# Patient Record
Sex: Male | Born: 1985 | Race: White | Hispanic: No | Marital: Single | Smoking: Current every day smoker
Health system: Southern US, Community
[De-identification: ages and names within clinical notes are randomized; demographics above are authoritative.]

## PROBLEM LIST (undated history)

## (undated) DIAGNOSIS — J45909 Unspecified asthma, uncomplicated: Secondary | ICD-10-CM

## (undated) DIAGNOSIS — J449 Chronic obstructive pulmonary disease, unspecified: Secondary | ICD-10-CM

## (undated) HISTORY — PX: ELBOW SURGERY: SHX618

## (undated) HISTORY — PX: SHOULDER ARTHROSCOPY: SHX128

## (undated) HISTORY — PX: MANDIBLE FRACTURE SURGERY: SHX706

---

## 2016-10-09 ENCOUNTER — Emergency Department
Admission: EM | Admit: 2016-10-09 | Discharge: 2016-10-09 | Disposition: A | Discharge status: Home / Self Care | Payer: Medicare Other | Attending: Emergency Medicine | Admitting: Emergency Medicine

## 2016-10-09 ENCOUNTER — Encounter: Payer: Self-pay | Admitting: Emergency Medicine

## 2016-10-09 DIAGNOSIS — R0781 Pleurodynia: Secondary | ICD-10-CM | POA: Diagnosis not present

## 2016-10-09 MED ORDER — KETOROLAC TROMETHAMINE 30 MG/ML IJ SOLN
30.0000 mg | Freq: Once | INTRAMUSCULAR | Status: AC
  Administered 2016-10-09: 30 mg via INTRAMUSCULAR
  Filled 2016-10-09: qty 1

## 2016-10-09 MED ORDER — CYCLOBENZAPRINE HCL 10 MG PO TABS: 10.0000 mg | ORAL_TABLET | Freq: Three times a day (TID) | ORAL | 0 refills | Status: AC | PRN

## 2016-10-09 MED ORDER — KETOROLAC TROMETHAMINE 10 MG PO TABS: 10.0000 mg | ORAL_TABLET | Freq: Four times a day (QID) | ORAL | 0 refills | Status: AC | PRN

## 2016-10-09 MED ORDER — CYCLOBENZAPRINE HCL 10 MG PO TABS
10.0000 mg | ORAL_TABLET | Freq: Once | ORAL | Status: AC
  Administered 2016-10-09: 10 mg via ORAL
  Filled 2016-10-09: qty 1
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
## 2016-10-09 NOTE — ED Notes (Signed)

## 2018-11-20 DIAGNOSIS — F329 Major depressive disorder, single episode, unspecified: Secondary | ICD-10-CM | POA: Insufficient documentation

## 2018-11-20 DIAGNOSIS — F3289 Other specified depressive episodes: Secondary | ICD-10-CM | POA: Insufficient documentation

## 2018-11-20 DIAGNOSIS — G909 Disorder of the autonomic nervous system, unspecified: Secondary | ICD-10-CM | POA: Insufficient documentation

## 2018-11-20 DIAGNOSIS — M2010 Hallux valgus (acquired), unspecified foot: Secondary | ICD-10-CM | POA: Insufficient documentation

## 2018-11-20 DIAGNOSIS — M5430 Sciatica, unspecified side: Secondary | ICD-10-CM | POA: Insufficient documentation

## 2018-11-20 DIAGNOSIS — G8921 Chronic pain due to trauma: Secondary | ICD-10-CM | POA: Insufficient documentation

## 2018-11-27 DIAGNOSIS — B178 Other specified acute viral hepatitis: Secondary | ICD-10-CM | POA: Insufficient documentation

## 2018-11-27 DIAGNOSIS — B199 Unspecified viral hepatitis without hepatic coma: Secondary | ICD-10-CM | POA: Insufficient documentation

## 2018-12-11 ENCOUNTER — Encounter: Payer: Self-pay | Admitting: Podiatry

## 2018-12-11 ENCOUNTER — Other Ambulatory Visit: Payer: Self-pay

## 2018-12-11 ENCOUNTER — Ambulatory Visit (INDEPENDENT_AMBULATORY_CARE_PROVIDER_SITE_OTHER): Payer: Medicare Other | Admitting: Podiatry

## 2018-12-11 ENCOUNTER — Ambulatory Visit (INDEPENDENT_AMBULATORY_CARE_PROVIDER_SITE_OTHER): Payer: Medicare Other

## 2018-12-11 DIAGNOSIS — M2012 Hallux valgus (acquired), left foot: Secondary | ICD-10-CM

## 2018-12-11 DIAGNOSIS — M7752 Other enthesopathy of left foot: Secondary | ICD-10-CM | POA: Diagnosis not present

## 2018-12-11 DIAGNOSIS — M2010 Hallux valgus (acquired), unspecified foot: Secondary | ICD-10-CM | POA: Diagnosis not present

## 2018-12-11 DIAGNOSIS — M7751 Other enthesopathy of right foot: Secondary | ICD-10-CM | POA: Diagnosis not present

## 2018-12-11 DIAGNOSIS — M2042 Other hammer toe(s) (acquired), left foot: Secondary | ICD-10-CM

## 2018-12-11 DIAGNOSIS — M2011 Hallux valgus (acquired), right foot: Secondary | ICD-10-CM

## 2018-12-11 NOTE — Patient Instructions (Signed)
Pre-Operative Instructions  Congratulations, you have decided to take an important step towards improving your quality of life.  You can be assured that the doctors and staff at [facility] will be with you every step of the way.  Here are some important things you should know:  1. Plan to be at the surgery center/hospital at least 1 (one) hour prior to your scheduled time, unless otherwise directed by the surgical center/hospital staff.  You must have a responsible adult accompany you, remain during the surgery and drive you home.  Make sure you have directions to the surgical center/hospital to ensure you arrive on time. 2. If you are having surgery at [facility] or [facility] hospitals, you will need a copy of your medical history and physical form from your family physician within one month prior to the date of surgery. We will give you a form for your primary physician to complete.  3. We make every effort to accommodate the date you request for surgery.  However, there are times where surgery dates or times have to be moved.  We will contact you as soon as possible if a change in schedule is required.   4. No aspirin/ibuprofen for one week before surgery.  If you are on aspirin, any non-steroidal anti-inflammatory medications (Mobic, Aleve, Ibuprofen) should not be taken seven (7) days prior to your surgery.  You make take Tylenol for pain prior to surgery.  5. Medications - If you are taking daily heart and blood pressure medications, seizure, reflux, allergy, asthma, anxiety, pain or diabetes medications, make sure you notify the surgery center/hospital before the day of surgery so they can tell you which medications you should take or avoid the day of surgery. 6. No food or drink after midnight the night before surgery unless directed otherwise by surgical center/hospital staff. 7. No alcoholic beverages 24-hours prior to surgery.  No smoking 24-hours prior or 24-hours after  surgery. 8. Wear loose pants or shorts. They should be loose enough to fit over bandages, boots, and casts. 9. Don't wear slip-on shoes. Sneakers are preferred. 10. Bring your boot with you to the surgery center/hospital.  Also bring crutches or a walker if your physician has prescribed it for you.  If you do not have this equipment, it will be provided for you after surgery. 11. If you have not been contacted by the surgery center/hospital by the day before your surgery, call to confirm the date and time of your surgery. 12. Leave-time from work may vary depending on the type of surgery you have.  Appropriate arrangements should be made prior to surgery with your employer. 13. Prescriptions will be provided immediately following surgery by your doctor.  Fill these as soon as possible after surgery and take the medication as directed. Pain medications will not be refilled on weekends and must be approved by the doctor. 14. Remove nail polish on the operative foot and avoid getting pedicures prior to surgery. 15. Wash the night before surgery.  The night before surgery wash the foot and leg well with water and the antibacterial soap provided. Be sure to pay special attention to beneath the toenails and in between the toes.  Wash for at least three (3) minutes. Rinse thoroughly with water and dry well with a towel.  Perform this wash unless told not to do so by your physician.  Enclosed: 1 Ice pack (please put in freezer the night before surgery)   1 Hibiclens skin cleaner     Pre-op instructions  If you have any questions regarding the instructions, please do not hesitate to call our office.  [place]: [address], [place], [place] [address] -- [phone]  [place]: [address] [place], [place] [address] -- [phone]  [place]: [address]  [place], [place] [address] -- [phone]   Website: [email or web address] 

## 2018-12-13 NOTE — Progress Notes (Signed)
   Subjective: 33 y.o. male presenting today as a new patient with a chief complaint of constant throbbing pain noted to the bilateral great toes secondary to bunions that have been present for the past few years. She states the 2nd toes are crossing over the great toes. Wearing shoes and walking increases the pain. She has been taking Ibuprofen and Tylenol as well as using heat therapy for treatment. Patient is here for further evaluation and treatment.   No past medical history on file.    Objective: Physical Exam General: The patient is alert and oriented x3 in no acute distress.  Dermatology: Skin is cool, dry and supple bilateral lower extremities. Negative for open lesions or macerations.  Vascular: Palpable pedal pulses bilaterally. No edema or erythema noted. Capillary refill within normal limits.  Neurological: Epicritic and protective threshold grossly intact bilaterally.   Musculoskeletal Exam: Clinical evidence of bunion deformity noted to the respective foot. There is moderate pain on palpation range of motion of the first MPJ. Lateral deviation of the hallux noted consistent with hallux abductovalgus. Hammertoe contracture also noted on clinical exam to digits 3 and 4 of the right foot and the 2nd digits bilaterally. Symptomatic pain on palpation and range of motion also noted to the metatarsal phalangeal joints of the respective hammertoe digits.    Radiographic Exam: Increased intermetatarsal angle greater than 15 with a hallux abductus angle greater than 30 noted on AP view. Moderate degenerative changes noted within the first MPJ. Contracture deformity also noted to the interphalangeal joints and MPJs of the digits of the respective hammertoes.    Assessment: 1. HAV w/ bunion deformity bilateral  2. Hammertoe deformity digits 3, 4 right; 2nd digit bilateral 3. 1st MPJ capsulitis bilateral     Plan of Care:  1. Patient was evaluated. X-Rays reviewed. 2. Today we  discussed the conservative versus surgical management of the presenting pathology. The patient opts for surgical management. All possible complications and details of the procedure were explained. All patient questions were answered. No guarantees were expressed or implied. 3. Authorization for surgery was initiated today. Surgery will consist of bunionectomy with double osteotomy left; PIPJ arthroplasty with MPJ capsulotomy 2nd left.  4. Injection of 0.5 mLs Celestone Soluspan injected into the bilateral 1st MPJs.  5. Return to clinic one week post op.   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    Edrick Kins, DPM [facility]  Dr. Edrick Kins, [place]                                        [place], [place] [address]                Office [phone]  Fax [phone]

## 2018-12-24 ENCOUNTER — Telehealth: Payer: Self-pay | Admitting: *Deleted

## 2018-12-24 NOTE — Telephone Encounter (Signed)
"  They told me to call you to schedule my surgery.  I forgot all about it."  Do you have a date that you would like?  "I'd like to do it this month."  Dr. Amalia Hailey cannot do it this month.  He can do it on December 11, 17 or 18.  "Let's schedule it for December 11, what time?"  Someone from the surgical center will give you a call a day or two prior to your surgery date and will give you your arrival time.  You need to go online and register if you have access to a computer.  "Ma'am, I don't fool around with no computer."  Don't worry about it, someone from the surgical center will give you a call a day or two prior to your surgery date and will ask you for the information they need.

## 2018-12-30 ENCOUNTER — Emergency Department
Admission: EM | Admit: 2018-12-30 | Discharge: 2018-12-30 | Disposition: A | Discharge status: Home / Self Care | Payer: Medicare Other | Attending: Emergency Medicine | Admitting: Emergency Medicine

## 2018-12-30 ENCOUNTER — Other Ambulatory Visit: Payer: Self-pay

## 2018-12-30 DIAGNOSIS — T7029XA Other effects of high altitude, initial encounter: Secondary | ICD-10-CM | POA: Insufficient documentation

## 2018-12-30 DIAGNOSIS — H9192 Unspecified hearing loss, left ear: Secondary | ICD-10-CM | POA: Diagnosis not present

## 2018-12-30 DIAGNOSIS — J449 Chronic obstructive pulmonary disease, unspecified: Secondary | ICD-10-CM | POA: Insufficient documentation

## 2018-12-30 DIAGNOSIS — W363XXA Explosion and rupture of pressurized-gas tank, initial encounter: Secondary | ICD-10-CM | POA: Insufficient documentation

## 2018-12-30 DIAGNOSIS — Y9389 Activity, other specified: Secondary | ICD-10-CM | POA: Insufficient documentation

## 2018-12-30 DIAGNOSIS — F17210 Nicotine dependence, cigarettes, uncomplicated: Secondary | ICD-10-CM | POA: Diagnosis not present

## 2018-12-30 DIAGNOSIS — Y9289 Other specified places as the place of occurrence of the external cause: Secondary | ICD-10-CM | POA: Insufficient documentation

## 2018-12-30 DIAGNOSIS — Z79899 Other long term (current) drug therapy: Secondary | ICD-10-CM | POA: Insufficient documentation

## 2018-12-30 DIAGNOSIS — Y990 Civilian activity done for income or pay: Secondary | ICD-10-CM | POA: Diagnosis not present

## 2018-12-30 HISTORY — DX: Unspecified asthma, uncomplicated: J45.909

## 2018-12-30 HISTORY — DX: Chronic obstructive pulmonary disease, unspecified: J44.9

## 2018-12-30 MED ORDER — ACETAMINOPHEN 325 MG PO TABS
650.0000 mg | ORAL_TABLET | Freq: Once | ORAL | Status: AC
  Administered 2018-12-30: 650 mg via ORAL
  Filled 2018-12-30: qty 2
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
## 2018-12-30 NOTE — ED Notes (Signed)
Spoke to Dr. Cherylann Banas, no blood work or scans at this time.

## 2018-12-30 NOTE — ED Notes (Signed)
NAD noted at time of D/C. Pt denies questions or concerns. Pt ambulatory to the lobby at this time.  

## 2018-12-30 NOTE — ED Notes (Signed)
Unable to obtain E-sig at this time due to computers not working.
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
## 2019-01-01 ENCOUNTER — Encounter: Payer: Self-pay | Admitting: Emergency Medicine

## 2019-01-01 ENCOUNTER — Emergency Department
Admission: EM | Admit: 2019-01-01 | Discharge: 2019-01-01 | Disposition: A | Discharge status: Home / Self Care | Payer: Medicare Other | Attending: Emergency Medicine | Admitting: Emergency Medicine

## 2019-01-01 ENCOUNTER — Other Ambulatory Visit: Payer: Self-pay

## 2019-01-01 DIAGNOSIS — Z79899 Other long term (current) drug therapy: Secondary | ICD-10-CM | POA: Diagnosis not present

## 2019-01-01 DIAGNOSIS — Z046 Encounter for general psychiatric examination, requested by authority: Secondary | ICD-10-CM | POA: Insufficient documentation

## 2019-01-01 DIAGNOSIS — Y906 Blood alcohol level of 120-199 mg/100 ml: Secondary | ICD-10-CM | POA: Diagnosis not present

## 2019-01-01 DIAGNOSIS — F4324 Adjustment disorder with disturbance of conduct: Secondary | ICD-10-CM | POA: Insufficient documentation

## 2019-01-01 DIAGNOSIS — J449 Chronic obstructive pulmonary disease, unspecified: Secondary | ICD-10-CM | POA: Insufficient documentation

## 2019-01-01 DIAGNOSIS — F17210 Nicotine dependence, cigarettes, uncomplicated: Secondary | ICD-10-CM | POA: Diagnosis not present

## 2019-01-01 DIAGNOSIS — F10129 Alcohol abuse with intoxication, unspecified: Secondary | ICD-10-CM | POA: Diagnosis not present

## 2019-01-01 DIAGNOSIS — Z008 Encounter for other general examination: Secondary | ICD-10-CM

## 2019-01-01 DIAGNOSIS — F319 Bipolar disorder, unspecified: Secondary | ICD-10-CM | POA: Diagnosis not present

## 2019-01-01 LAB — COMPREHENSIVE METABOLIC PANEL
ALT: 74 U/L — ABNORMAL HIGH (ref 0–44)
AST: 60 U/L — ABNORMAL HIGH (ref 15–41)
Albumin: 4.3 g/dL (ref 3.5–5.0)
Alkaline Phosphatase: 87 U/L (ref 38–126)
Anion gap: 9 (ref 5–15)
BUN: 21 mg/dL — ABNORMAL HIGH (ref 6–20)
CO2: 19 mmol/L — ABNORMAL LOW (ref 22–32)
Calcium: 8.7 mg/dL — ABNORMAL LOW (ref 8.9–10.3)
Chloride: 108 mmol/L (ref 98–111)
Creatinine, Ser: 0.97 mg/dL (ref 0.61–1.24)
GFR calc Af Amer: >60 mL/min (ref >60)
GFR calc non Af Amer: >60 mL/min (ref >60)
Glucose, Bld: 113 mg/dL — ABNORMAL HIGH (ref 70–99)
Potassium: 4.1 mmol/L (ref 3.5–5.1)
Sodium: 136 mmol/L (ref 135–145)
Total Bilirubin: 0.6 mg/dL (ref 0.3–1.2)
Total Protein: 7.7 g/dL (ref 6.5–8.1)

## 2019-01-01 LAB — CBC
HCT: 45.9 % (ref 39.0–52.0)
Hemoglobin: 16.1 g/dL (ref 13.0–17.0)
MCH: 33.5 pg (ref 26.0–34.0)
MCHC: 35.1 g/dL (ref 30.0–36.0)
MCV: 95.4 fL (ref 80.0–100.0)
Platelets: 286 10*3/uL (ref 150–400)
RBC: 4.81 MIL/uL (ref 4.22–5.81)
RDW: 12.4 % (ref 11.5–15.5)
WBC: 11.7 10*3/uL — ABNORMAL HIGH (ref 4.0–10.5)
nRBC: 0 % (ref 0.0–0.2)

## 2019-01-01 LAB — SALICYLATE LEVEL: Salicylate Lvl: <7 mg/dL (ref 2.8–30.0)

## 2019-01-01 LAB — ACETAMINOPHEN LEVEL: Acetaminophen (Tylenol), Serum: <10 ug/mL — ABNORMAL LOW (ref 10–30)

## 2019-01-01 LAB — ETHANOL: Alcohol, Ethyl (B): 123 mg/dL — ABNORMAL HIGH (ref <10)

## 2019-01-01 MED ORDER — LORAZEPAM 2 MG/ML IJ SOLN
INTRAMUSCULAR | Status: AC
  Filled 2019-01-01: qty 1

## 2019-01-01 MED ORDER — HALOPERIDOL LACTATE 5 MG/ML IJ SOLN
INTRAMUSCULAR | Status: AC
  Filled 2019-01-01: qty 1

## 2019-01-01 NOTE — ED Notes (Signed)
Pr provided with a breakfast tray and cup of water.

## 2019-01-01 NOTE — ED Notes (Signed)
Dietary called for soft breakfast tray, patient states has no teeth, unable to chew the bacon.
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
## 2019-01-01 NOTE — Discharge Instructions (Addendum)
Cleared by psychiatry team to go home.  Return to ER for any other concerns.
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
## 2019-01-01 NOTE — ED Notes (Signed)
Patient IVD'd belonging obtained, patient refusing to change into burgundy scrubs, as per Dr. Nickolas Madrid ok to leave him in his home clothes until evaluated by psych this morning.
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
## 2019-01-01 NOTE — ED Notes (Signed)
Patient evaluated by psych awaiting discharge papers.

## 2019-01-01 NOTE — ED Notes (Signed)
Pt ambulatory to bathroom. This tech asked pt if he was provided a cup for urine. Pt stated "they took my blood they don't need urine."
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
## 2019-01-21 ENCOUNTER — Telehealth: Payer: Self-pay | Admitting: *Deleted

## 2019-01-21 NOTE — Telephone Encounter (Signed)
Patient cancel. Just started a new job and needs Christmas money. Will call back.  [facility], Business Office  [address]  [place], [place] [address] [phone] (F)   I canceled his surgery for 01/25/2019 per the email from Aneta at [facility].

## 2019-01-25 ENCOUNTER — Telehealth: Payer: Self-pay | Admitting: *Deleted

## 2019-01-25 NOTE — Telephone Encounter (Signed)
"  I want to see what time my appointment is today."  I am returning your call.  We do not have you scheduled for today.  You called and stated you had just started a new job and would have to be able to take off from work.  "I wasn't able to start the job because I didn't have the identification that was needed.  It's going to take me two to three weeks to get the IDs.  So, I thought I would go ahead and have the surgery."  I don't know if we'll be able to do it today.  I'll call and see what I can find out.  "Could you do that and call me back and let me know.  I had a family member come up from [place] to take me."  I'll call you back and let you know.  "Did you find out anything?"  Yes, I did.  Dr. Amalia Hailey will not be able to do your surgery today.  "Okay, I'd like to schedule it for as soon as possible."  Dr. Amalia Hailey next available date is not until March.  "Okay, that is fine.  What date?"  He can do it on May 09, 2019.  "Okay, I will put it down."  I called Caren Griffins at the surgical center and asked her to put Mr. Walthall on the schedule for 05/09/2019 with Dr. Amalia Hailey.

## 2019-02-05 ENCOUNTER — Other Ambulatory Visit: Payer: Self-pay | Admitting: Student

## 2019-02-05 DIAGNOSIS — R768 Other specified abnormal immunological findings in serum: Secondary | ICD-10-CM

## 2019-02-05 DIAGNOSIS — B182 Chronic viral hepatitis C: Secondary | ICD-10-CM | POA: Insufficient documentation

## 2019-02-13 ENCOUNTER — Other Ambulatory Visit: Payer: Self-pay

## 2019-02-13 ENCOUNTER — Ambulatory Visit
Admission: RE | Admit: 2019-02-13 | Discharge: 2019-02-13 | Disposition: A | Discharge status: Home / Self Care | Payer: Medicare Other | Source: Ambulatory Visit | Attending: Student | Admitting: Student

## 2019-02-13 DIAGNOSIS — R768 Other specified abnormal immunological findings in serum: Secondary | ICD-10-CM | POA: Insufficient documentation

## 2019-02-15 DIAGNOSIS — J449 Chronic obstructive pulmonary disease, unspecified: Secondary | ICD-10-CM | POA: Diagnosis present

## 2019-02-19 ENCOUNTER — Other Ambulatory Visit: Payer: Self-pay | Admitting: Student

## 2019-02-19 DIAGNOSIS — R93429 Abnormal radiologic findings on diagnostic imaging of unspecified kidney: Secondary | ICD-10-CM

## 2019-02-25 ENCOUNTER — Ambulatory Visit: Admission: RE | Admit: 2019-02-25 | Payer: Medicare Other | Source: Ambulatory Visit

## 2019-02-25 ENCOUNTER — Ambulatory Visit
Admission: RE | Admit: 2019-02-25 | Discharge: 2019-02-25 | Disposition: A | Discharge status: Home / Self Care | Payer: Medicare Other | Source: Ambulatory Visit | Attending: Student | Admitting: Student

## 2019-02-25 ENCOUNTER — Other Ambulatory Visit: Payer: Self-pay | Admitting: Student

## 2019-02-25 ENCOUNTER — Other Ambulatory Visit: Payer: Self-pay

## 2019-02-25 DIAGNOSIS — R93429 Abnormal radiologic findings on diagnostic imaging of unspecified kidney: Secondary | ICD-10-CM | POA: Diagnosis present

## 2019-02-25 MED ORDER — IOHEXOL 300 MG/ML  SOLN
100.0000 mL | Freq: Once | INTRAMUSCULAR | Status: AC | PRN
  Administered 2019-02-25: 100 mL via INTRAVENOUS

## 2019-02-28 ENCOUNTER — Other Ambulatory Visit: Payer: Self-pay

## 2019-02-28 ENCOUNTER — Emergency Department
Admission: EM | Admit: 2019-02-28 | Discharge: 2019-02-28 | Disposition: A | Discharge status: Home / Self Care | Payer: Medicare Other | Attending: Emergency Medicine | Admitting: Emergency Medicine

## 2019-02-28 ENCOUNTER — Encounter: Payer: Self-pay | Admitting: Emergency Medicine

## 2019-02-28 DIAGNOSIS — M5431 Sciatica, right side: Secondary | ICD-10-CM | POA: Diagnosis not present

## 2019-02-28 DIAGNOSIS — J45909 Unspecified asthma, uncomplicated: Secondary | ICD-10-CM | POA: Diagnosis not present

## 2019-02-28 DIAGNOSIS — F17210 Nicotine dependence, cigarettes, uncomplicated: Secondary | ICD-10-CM | POA: Diagnosis not present

## 2019-02-28 DIAGNOSIS — Z79899 Other long term (current) drug therapy: Secondary | ICD-10-CM | POA: Insufficient documentation

## 2019-02-28 DIAGNOSIS — J449 Chronic obstructive pulmonary disease, unspecified: Secondary | ICD-10-CM | POA: Insufficient documentation

## 2019-02-28 DIAGNOSIS — M549 Dorsalgia, unspecified: Secondary | ICD-10-CM | POA: Diagnosis present

## 2019-02-28 LAB — URINALYSIS, COMPLETE (UACMP) WITH MICROSCOPIC
Bacteria, UA: NONE SEEN
Bilirubin Urine: NEGATIVE
Glucose, UA: NEGATIVE mg/dL
Hgb urine dipstick: NEGATIVE
Ketones, ur: NEGATIVE mg/dL
Leukocytes,Ua: NEGATIVE
Nitrite: NEGATIVE
Protein, ur: NEGATIVE mg/dL
Specific Gravity, Urine: 1.006 (ref 1.005–1.030)
pH: 6 (ref 5.0–8.0)

## 2019-02-28 MED ORDER — PREDNISONE 10 MG (21) PO TBPK: ORAL_TABLET | ORAL | 0 refills | Status: DC

## 2019-02-28 MED ORDER — TRAMADOL HCL 50 MG PO TABS: 50.0000 mg | ORAL_TABLET | Freq: Four times a day (QID) | ORAL | 0 refills | Status: DC | PRN

## 2019-02-28 MED ORDER — KETOROLAC TROMETHAMINE 30 MG/ML IJ SOLN
30.0000 mg | Freq: Once | INTRAMUSCULAR | Status: AC
  Administered 2019-02-28: 30 mg via INTRAMUSCULAR
  Filled 2019-02-28 (×2): qty 1

## 2019-02-28 NOTE — Discharge Instructions (Signed)
Please follow up with neurosurgery if not improving over the next few days.  Return to the ER for symptoms that change or worsen if unable to schedule an appointment.

## 2019-02-28 NOTE — ED Provider Notes (Signed)
[facility] Emergency Department Provider Note ____________________________________________  Time seen: Approximately 3:27 PM  I have reviewed the triage vital signs and the nursing notes.  HISTORY  Chief Complaint Back Pain   HPI Donald [name] is a 34 y.o. male presents with right side back pain radiation into posterior thigh to knee. No relief with ibuprofen and flexeril. Symptoms may have been caused by pulling a heavy object at work a few days ago. No urinary symptoms.  Past Medical History:  Diagnosis Date  . Asthma   . COPD (chronic obstructive pulmonary disease) [facility])     Patient Active Problem List   Diagnosis Date Noted  . Adjustment disorder with disturbance of conduct   . Other viral hepatitis 11/27/2018  . Chronic pain due to trauma 11/20/2018  . Hallux valgus, acquired 11/20/2018  . Other depressive disorder 11/20/2018  . Peripheral autonomic neuropathy 11/20/2018  . Sciatica 11/20/2018    Past Surgical History:  Procedure Laterality Date  . ELBOW SURGERY    . MANDIBLE FRACTURE SURGERY    . SHOULDER ARTHROSCOPY      Prior to Admission medications   Medication Sig Start Date End Date Taking? Authorizing Provider  busPIRone (BUSPAR) 10 MG tablet busPIRone HCl 10 MG Oral Tablet QTY: 0 tablet Days: 0 Refills: 0  Written: 11/20/18 Patient Instructions: 11/20/18   [provider]  cyclobenzaprine (FEXMID) 7.5 MG tablet Cyclobenzaprine HCl 7.5 MG Oral Tablet QTY: 60 tablet Days: 30 Refills: 0  Written: 11/27/18 Patient Instructions: 1 tablet by mouth twice daily as needed for back pain 11/27/18   [provider]  meloxicam (MOBIC) 15 MG tablet Meloxicam 15 MG Oral Tablet QTY: 90 tablet Days: 90 Refills: 0  Written: 11/20/18 Patient Instructions: once a day 11/20/18   [provider]  predniSONE (STERAPRED UNI-PAK 21 TAB) 10 MG (21) TBPK tablet Take 6 tablets on the first day and decrease by 1 tablet each day until  finished. 02/28/19   , Rulon Eisenmenger B, FNP  pregabalin (LYRICA) 75 MG capsule Take 75 mg by mouth 2 (two) times daily.    [provider]  traMADol (ULTRAM) 50 MG tablet Take 1 tablet (50 mg total) by mouth every 6 (six) hours as needed. 02/28/19   Chinita Pester, FNP    Allergies Ceclor  [cefaclor]  No family history on file.  Social History Social History   Tobacco Use  . Smoking status: Current Every Day Smoker    Packs/day: 0.50    Types: Cigarettes  . Smokeless tobacco: Never Used  . Tobacco comment: 1/5 ppd  Substance Use Topics  . Alcohol use: Yes    Comment: social drinker  . Drug use: Not Currently    Review of Systems Constitutional: Well appearing. Respiratory: Negative for dyspnea. Cardiovascular: Negative for change in skin temperature or color. Musculoskeletal:   Negative for chronic steroid use   Negative for trauma in the presence of osteoporosis  Negative for age over 27 and trauma.  Negative for constitutional symptoms, or history of cancer   Negative for pain worse at night. Skin: Negative for rash, lesion, or wound.  Genitourinary: Negative for urinary retention. Rectal: Negative for fecal incontinence or new onset constipation/bowel habit changes. Hematological/Immunilogical: Negative for immunosuppression, IV drug use, or fever Neurological: Negative for burning, tingling, numb, electric, radiating pain in the lower extremities.                        Negative for  saddle anesthesia.                        Negative for focal neurologic deficit, progressive or disabling symptoms             Negative for saddle anesthesia. ____________________________________________   PHYSICAL EXAM:  VITAL SIGNS: ED Triage Vitals  Enc Vitals Group     BP 02/28/19 1456 120/85     Pulse Rate 02/28/19 1456 75     Resp 02/28/19 1456 16     Temp 02/28/19 1456 98.6 F (37 C)     Temp Source 02/28/19 1456 Oral     SpO2 02/28/19 1456 96 %     Weight  02/28/19 1452 210 lb (95.3 kg)     Height 02/28/19 1452 6' (1.829 m)     Head Circumference --      Peak Flow --      Pain Score 02/28/19 1452 10     Pain Loc --      Pain Edu? --      Excl. in [place]? --     Constitutional: Alert and oriented. Well appearing and in no acute distress. Eyes: Conjunctivae are clear without discharge or drainage.  Head: Atraumatic. Neck: Full, active range of motion. Respiratory: Respirations even and unlabored. Musculoskeletal: Decreased ROM of the back and extremities, Strength 5/5 of the lower extremities as tested. Neurologic: Reflexes of the lower extremities are 2+. Positive straight leg raise on the right side. Skin: Atraumatic.  Psychiatric: Behavior and affect are normal.  ____________________________________________   LABS (all labs ordered are listed, but only abnormal results are displayed)  Labs Reviewed  URINALYSIS, COMPLETE (UACMP) WITH MICROSCOPIC - Abnormal; Notable for the following components:      Result Value   Color, Urine YELLOW (*)    APPearance CLEAR (*)    All other components within normal limits   ____________________________________________  RADIOLOGY  Not indicated. ____________________________________________   PROCEDURES  Procedure(s) performed:  Procedures ____________________________________________   INITIAL IMPRESSION / ASSESSMENT AND PLAN / ED COURSE  Donald [name] is a 34 y.o. male presents to the emergency department for treatment and evaluation of right side flank/back pain.   He will be treated with prednisone and tramadol.  He was advised that he needs to call his neurosurgeon and request a follow-up appointment.  He is to return to the emergency department for symptoms of change or worsen if unable to schedule an appointment.  Medications  ketorolac (TORADOL) 30 MG/ML injection 30 mg (30 mg Intramuscular Given 02/28/19 1620)    ED Discharge Orders         Ordered    predniSONE (STERAPRED  UNI-PAK 21 TAB) 10 MG (21) TBPK tablet     02/28/19 1615    traMADol (ULTRAM) 50 MG tablet  Every 6 hours PRN     02/28/19 1615           Pertinent labs & imaging results that were available during my care of the patient were reviewed by me and considered in my medical decision making (see chart for details).  _________________________________________   FINAL CLINICAL IMPRESSION(S) / ED DIAGNOSES  Final diagnoses:  Sciatica of right side     If controlled substance prescribed during this visit, 12 month history viewed on the [place] prior to issuing an initial prescription for Schedule II or III opiod.   Victorino Dike, FNP 02/28/19 1924    Delman Kitten, MD 02/28/19 2032

## 2019-02-28 NOTE — ED Triage Notes (Signed)
Right sided back pain that began this am. Has been seeing a Dr in [place] for his left sided back pain. Hx of kidney stones but says this is not the same feeling. States his kidneys were checked recently and they "checked out." NAD.

## 2019-05-09 ENCOUNTER — Encounter: Payer: Self-pay | Admitting: Podiatry

## 2019-05-09 DIAGNOSIS — M7752 Other enthesopathy of left foot: Secondary | ICD-10-CM | POA: Diagnosis not present

## 2019-05-09 DIAGNOSIS — M2012 Hallux valgus (acquired), left foot: Secondary | ICD-10-CM

## 2019-05-09 DIAGNOSIS — M21542 Acquired clubfoot, left foot: Secondary | ICD-10-CM | POA: Diagnosis not present

## 2019-05-09 DIAGNOSIS — M2042 Other hammer toe(s) (acquired), left foot: Secondary | ICD-10-CM | POA: Diagnosis not present

## 2019-05-09 MED ORDER — OXYCODONE-ACETAMINOPHEN 5-325 MG PO TABS: 1.0000 | ORAL_TABLET | Freq: Four times a day (QID) | ORAL | 0 refills | Status: DC | PRN

## 2019-05-09 NOTE — Addendum Note (Signed)
Addended by: Nicholes Rough on: 05/09/2019 11:26 AM   Modules accepted: Orders

## 2019-05-10 ENCOUNTER — Other Ambulatory Visit: Payer: Self-pay | Admitting: Podiatry

## 2019-05-10 ENCOUNTER — Other Ambulatory Visit: Payer: Self-pay

## 2019-05-10 ENCOUNTER — Telehealth: Payer: Self-pay | Admitting: *Deleted

## 2019-05-10 DIAGNOSIS — M2042 Other hammer toe(s) (acquired), left foot: Secondary | ICD-10-CM

## 2019-05-10 DIAGNOSIS — M2010 Hallux valgus (acquired), unspecified foot: Secondary | ICD-10-CM

## 2019-05-10 DIAGNOSIS — Z9889 Other specified postprocedural states: Secondary | ICD-10-CM

## 2019-05-10 MED ORDER — HYDROMORPHONE HCL 4 MG PO TABS: 4.0000 mg | ORAL_TABLET | Freq: Four times a day (QID) | ORAL | 0 refills | Status: DC | PRN

## 2019-05-10 NOTE — Telephone Encounter (Signed)
I called patient, he stated "I am in a great amount of pain, I took several of the pain medication last night and it didn't help. I cant sleep and the pain is unbearable.  Can he call me in something else so I can sleep?"  He also requested some crutches to help get around.  I informed him that I would let Dr. Logan Bores know and will call him back If Dr. Logan Bores approved stronger medication and crutches.

## 2019-05-10 NOTE — Telephone Encounter (Signed)
Rx for dilaudid sent to pharmacy. - Dr. Logan Bores

## 2019-05-10 NOTE — Progress Notes (Signed)
PRN postop 

## 2019-05-10 NOTE — Telephone Encounter (Signed)
Pt states he has surgery yesterday and requested a call.

## 2019-05-10 NOTE — Telephone Encounter (Signed)
Patient has been notified of rx and I spoke with [facility] nurse, she requested a rx for crutches.  DME Script was faxed to [phone]

## 2019-05-15 ENCOUNTER — Telehealth: Payer: Self-pay

## 2019-05-15 NOTE — Telephone Encounter (Signed)
Star, RN from [facility] called stated that patient called them to come change dressing on foot today.  He told the nurse that his bandage had come off, and a staple had come out. He also informed her that he had taken a shower and got foot wet.  She stated that staples were intact, area around incision was slightly red, looked wet and had slight drainage.  She cleaned with NS, applied Telfa gauze to incision, wrapped with Kerlix and secured with ace bandage.  He will follow up with Dr. Logan Bores on Tuesday 05/21/2019

## 2019-05-17 ENCOUNTER — Encounter: Payer: Medicare Other | Admitting: Podiatry

## 2019-05-18 ENCOUNTER — Other Ambulatory Visit: Payer: Self-pay | Admitting: Podiatry

## 2019-05-18 MED ORDER — HYDROMORPHONE HCL 4 MG PO TABS: 4.0000 mg | ORAL_TABLET | Freq: Four times a day (QID) | ORAL | 0 refills | Status: DC | PRN

## 2019-05-18 NOTE — Progress Notes (Signed)
PRN postop 

## 2019-05-21 ENCOUNTER — Ambulatory Visit (INDEPENDENT_AMBULATORY_CARE_PROVIDER_SITE_OTHER): Payer: Medicare Other

## 2019-05-21 ENCOUNTER — Ambulatory Visit (INDEPENDENT_AMBULATORY_CARE_PROVIDER_SITE_OTHER): Payer: Medicare Other | Admitting: Podiatry

## 2019-05-21 ENCOUNTER — Encounter: Payer: Self-pay | Admitting: Podiatry

## 2019-05-21 ENCOUNTER — Other Ambulatory Visit: Payer: Self-pay

## 2019-05-21 VITALS — Temp 98.7°F

## 2019-05-21 DIAGNOSIS — M2042 Other hammer toe(s) (acquired), left foot: Secondary | ICD-10-CM

## 2019-05-21 DIAGNOSIS — Z9889 Other specified postprocedural states: Secondary | ICD-10-CM

## 2019-05-23 NOTE — Progress Notes (Signed)
   Subjective:  Patient presents today status post bunionectomy and 2nd hammertoe repair left. DOS: 05/09/2019. He states he is doing well and improving. He reports a decrease in the swelling. He has been changing his dressing daily. He states he is currently taking Doxycycline as directed. There are no worsening factors noted. Patient is here for further evaluation and treatment.    Past Medical History:  Diagnosis Date  . Asthma   . COPD (chronic obstructive pulmonary disease) (HCC)       Objective/Physical Exam Neurovascular status intact.  Skin incisions appear to be well coapted with sutures and staples intact. No sign of infectious process noted. No dehiscence. No active bleeding noted. Moderate edema noted to the surgical extremity.  Radiographic Exam:  Orthopedic hardware and osteotomies sites appear to be stable with routine healing.  Assessment: 1. s/p bunionectomy and 2nd hammertoe repair left. DOS: 05/09/2019   Plan of Care:  1. Patient was evaluated. X-rays reviewed 2. Dressing changed.  3. Continue minimal weightbearing in CAM boot.  4. Continue taking Doxycycline as prescribed.  5. Return to clinic in one week.   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    Felecia Shelling, DPM [facility]  Dr. Felecia Shelling, DPM    [address]                                        [place], [place] [address]                Office [phone]  Fax [phone]

## 2019-05-24 ENCOUNTER — Encounter: Payer: Medicare Other | Admitting: Podiatry

## 2019-05-24 ENCOUNTER — Telehealth: Payer: Self-pay

## 2019-05-24 ENCOUNTER — Other Ambulatory Visit: Payer: Self-pay | Admitting: Podiatry

## 2019-05-24 MED ORDER — HYDROMORPHONE HCL 4 MG PO TABS: 4.0000 mg | ORAL_TABLET | Freq: Four times a day (QID) | ORAL | 0 refills | Status: DC | PRN

## 2019-05-24 NOTE — Telephone Encounter (Signed)
Patient called requesting refill on pain medication.  Please send to pharmacy if you approve

## 2019-05-24 NOTE — Progress Notes (Signed)
PRN postop 

## 2019-05-24 NOTE — Telephone Encounter (Signed)
Rx sent 

## 2019-05-28 ENCOUNTER — Encounter: Payer: Medicare Other | Admitting: Podiatry

## 2019-05-30 ENCOUNTER — Telehealth: Payer: Self-pay | Admitting: *Deleted

## 2019-05-30 NOTE — Telephone Encounter (Signed)
"  I missed my appointment the other day but I'm scheduled to come in on Tuesday.  I was trying to vacuum and the cord got caught on the pin.  The pin came out a little bit, not much.  It doesn't hurt are anything.  What should I do?"  If it only came out a little and it doesn't hurt, we'll just see you at your scheduled appointment.  Do not try to push it back in.  "Okay, that's what I wanted to know, if I should try to push it back in."  Do not try to push it back in.

## 2019-05-31 ENCOUNTER — Telehealth: Payer: Self-pay

## 2019-05-31 ENCOUNTER — Telehealth: Payer: Self-pay | Admitting: *Deleted

## 2019-05-31 MED ORDER — HYDROMORPHONE HCL 4 MG PO TABS: 4.0000 mg | ORAL_TABLET | ORAL | 0 refills | Status: AC | PRN

## 2019-05-31 NOTE — Telephone Encounter (Signed)
Donald [name] - Donald [name] states pt's surgery pin is coming out of his toe. I told Donald [name], first often the cause is too much activity it causes the pin to work itself out, do not push back in the toe, and we would like to see him in the office today.Pt said "Hell no!" I instructed Donald [name] to prepare a splint of gauze to fit between toe and bend of the surgical pin bend, then cover another gauze to keep from catching and redress as previously, if the pin should come out cover the tip area with antibiotic ointment and a bandaid, until seen on 05/17/2019.

## 2019-05-31 NOTE — Telephone Encounter (Signed)
I spoke with Dr. Logan Bores regarding pin coming out.  Donald [name] stated that pin was out about 1 to 1 1/2 inches.  Per Dr. Logan Bores, the nurse can pull the pin and dress with iodine ointment and wrap with gauze.  I informed Donald [name] of Dr. Logan Bores instructions and she said she would offer that to patient and if he agreed, she will pull pin.  He will follow up with Dr. Logan Bores on Tuesday

## 2019-05-31 NOTE — Addendum Note (Signed)
Addended by: Nicholes Rough on: 05/31/2019 03:27 PM   Modules accepted: Orders

## 2019-05-31 NOTE — Telephone Encounter (Signed)
DOne

## 2019-05-31 NOTE — Telephone Encounter (Signed)
Patient called requesting a refill of his Dilaudid.  He is Dr. Logan Bores post op patient.  Will you be able to fill this for him please?

## 2019-06-03 NOTE — Telephone Encounter (Signed)
Patient notified of medicaton

## 2019-06-04 ENCOUNTER — Ambulatory Visit (INDEPENDENT_AMBULATORY_CARE_PROVIDER_SITE_OTHER): Payer: Medicare Other | Admitting: Podiatry

## 2019-06-04 ENCOUNTER — Other Ambulatory Visit: Payer: Self-pay

## 2019-06-04 DIAGNOSIS — M2042 Other hammer toe(s) (acquired), left foot: Secondary | ICD-10-CM

## 2019-06-04 DIAGNOSIS — M2010 Hallux valgus (acquired), unspecified foot: Secondary | ICD-10-CM

## 2019-06-04 DIAGNOSIS — Z9889 Other specified postprocedural states: Secondary | ICD-10-CM

## 2019-06-05 ENCOUNTER — Telehealth: Payer: Self-pay | Admitting: Podiatry

## 2019-06-05 ENCOUNTER — Telehealth: Payer: Self-pay

## 2019-06-05 NOTE — Telephone Encounter (Signed)
Please fill pain medication if you approve.  Thanks!

## 2019-06-05 NOTE — Telephone Encounter (Signed)
Pt called wanting a refill on medication °

## 2019-06-05 NOTE — Telephone Encounter (Signed)
error 

## 2019-06-06 ENCOUNTER — Other Ambulatory Visit: Payer: Self-pay | Admitting: Podiatry

## 2019-06-06 MED ORDER — HYDROMORPHONE HCL 4 MG PO TABS: 4.0000 mg | ORAL_TABLET | Freq: Three times a day (TID) | ORAL | 0 refills | Status: DC | PRN

## 2019-06-06 NOTE — Progress Notes (Signed)
PRN postop 

## 2019-06-07 ENCOUNTER — Encounter: Payer: Medicare Other | Admitting: Podiatry

## 2019-06-07 NOTE — Progress Notes (Signed)
   Subjective:  Patient presents today status post bunionectomy and 2nd hammertoe repair left. DOS: 05/09/2019. He states he is doing well overall. He reports swelling of the foot. He denies any significant pain or modifying factors. He has been using the CAM boot as directed. Patient is here for further evaluation and treatment.   Past Medical History:  Diagnosis Date  . Asthma   . COPD (chronic obstructive pulmonary disease) (HCC)       Objective/Physical Exam Neurovascular status intact.  Skin incisions appear to be well coapted with sutures and staples intact. No sign of infectious process noted. No dehiscence. No active bleeding noted. Moderate edema noted to the surgical extremity.  Assessment: 1. s/p bunionectomy and 2nd hammertoe repair left. DOS: 05/09/2019   Plan of Care:  1. Patient was evaluated.  2. Staples removed.  3. Percutaneous pin removed.  4. Post op shoe dispensed. Discontinue using CAM boot.  5. Return to clinic in 4 weeks.   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    Felecia Shelling, DPM [facility]  Dr. Felecia Shelling, DPM    [address]                                        [place], [place] [address]                Office [phone]  Fax [phone]

## 2019-06-14 ENCOUNTER — Telehealth: Payer: Self-pay | Admitting: *Deleted

## 2019-06-14 NOTE — Telephone Encounter (Signed)
"  I'm calling you about Donald [name].  I spoke to you about him before.  His foot pretty much healed to the point where we aren't doing any treatments on it.  He is non-compliant with his weight bearing.  We just want to see if Dr. Logan Bores is ready for discharge yet or does he want [name] to go ahead to go ahead and keep him through the rest of his cert period, which is about three to four weeks left?  Give me a call back at your earliest convenience.

## 2019-07-05 ENCOUNTER — Encounter: Payer: Medicare Other | Admitting: Podiatry

## 2019-07-23 NOTE — Progress Notes (Signed)
This encounter was created in error - please disregard.

## 2019-07-26 ENCOUNTER — Ambulatory Visit: Payer: Medicare Other | Admitting: Podiatry

## 2019-08-16 ENCOUNTER — Ambulatory Visit: Payer: Medicare Other

## 2019-08-16 ENCOUNTER — Ambulatory Visit: Payer: Medicare Other | Admitting: Podiatry

## 2019-08-30 ENCOUNTER — Ambulatory Visit (INDEPENDENT_AMBULATORY_CARE_PROVIDER_SITE_OTHER): Payer: Medicare Other | Admitting: Podiatry

## 2019-08-30 ENCOUNTER — Other Ambulatory Visit: Payer: Self-pay

## 2019-08-30 ENCOUNTER — Ambulatory Visit (INDEPENDENT_AMBULATORY_CARE_PROVIDER_SITE_OTHER): Payer: Medicare Other

## 2019-08-30 DIAGNOSIS — M21611 Bunion of right foot: Secondary | ICD-10-CM | POA: Diagnosis not present

## 2019-08-30 DIAGNOSIS — M2041 Other hammer toe(s) (acquired), right foot: Secondary | ICD-10-CM

## 2019-08-30 DIAGNOSIS — M2042 Other hammer toe(s) (acquired), left foot: Secondary | ICD-10-CM

## 2019-08-30 DIAGNOSIS — M7752 Other enthesopathy of left foot: Secondary | ICD-10-CM | POA: Diagnosis not present

## 2019-08-30 NOTE — Progress Notes (Signed)
   Subjective: 34 y.o. male presenting today for surgical consult of right foot.  Patient had surgery to his left bunion and second hammertoe on 05/09/2019.  He states he is doing very well however he has having some pain to the third toe of the left foot now.  The pain has been gradual over the past 3-4 weeks.  He states that he is also ready to have surgery performed on his right foot.  He presents today for further treatment and evaluation  Past Medical History:  Diagnosis Date  . Asthma   . COPD (chronic obstructive pulmonary disease) (HCC)      Objective: Physical Exam General: The patient is alert and oriented x3 in no acute distress.  Dermatology: Skin is cool, dry and supple bilateral lower extremities. Negative for open lesions or macerations.  Vascular: Palpable pedal pulses bilaterally. No edema or erythema noted. Capillary refill within normal limits.  Neurological: Epicritic and protective threshold grossly intact bilaterally.   Musculoskeletal Exam: Clinical evidence of bunion deformity noted to the respective foot. There is moderate pain on palpation range of motion of the first MPJ. Lateral deviation of the hallux noted consistent with hallux abductovalgus. Hammertoe contracture also noted on clinical exam to digits #2 of the right foot. Symptomatic pain on palpation and range of motion also noted to the metatarsal phalangeal joints of the respective hammertoe digits.    There is also pain on palpation and range of motion to the third MTPJ of the left foot.  Left foot bunion and second hammertoe are otherwise in good rectus position.  Radiographic Exam taken on previous visit right foot: Increased intermetatarsal angle greater than 15 with a hallux abductus angle greater than 30 noted on AP view. Moderate degenerative changes noted within the first MPJ. Contracture deformity also noted to the interphalangeal joints and MPJs of the digits of the respective  hammertoes.    Assessment: 1. HAV w/ bunion deformity right 2. Hammertoe deformity second digit right 3.  Third MPJ capsulitis left   Plan of Care:  1. Patient was evaluated. X-Rays reviewed that were taken at previous exam 2. Today we discussed the conservative versus surgical management of the presenting pathology. The patient opts for surgical management. All possible complications and details of the procedure were explained. All patient questions were answered. No guarantees were expressed or implied. 3. Authorization for surgery was initiated today. Surgery will consist of bunionectomy with double osteotomy right.  PIPJ arthroplasty with MTPJ capsulotomy second right.  Weil decompression osteotomy second metatarsal right. 4.  Injection of 0.5 cc Celestone Soluspan injected in the third MTPJ left foot  5.  Return to clinic 1 week postop     Felecia Shelling, DPM [facility]  Dr. Felecia Shelling, DPM    [address]                                        [place], [place] [address]                Office [phone]  Fax [phone]

## 2019-10-03 ENCOUNTER — Other Ambulatory Visit: Payer: Self-pay | Admitting: Podiatry

## 2019-10-03 DIAGNOSIS — M7751 Other enthesopathy of right foot: Secondary | ICD-10-CM | POA: Diagnosis not present

## 2019-10-03 DIAGNOSIS — M2041 Other hammer toe(s) (acquired), right foot: Secondary | ICD-10-CM | POA: Diagnosis not present

## 2019-10-03 DIAGNOSIS — M21541 Acquired clubfoot, right foot: Secondary | ICD-10-CM

## 2019-10-03 DIAGNOSIS — M2011 Hallux valgus (acquired), right foot: Secondary | ICD-10-CM

## 2019-10-03 MED ORDER — DOXYCYCLINE HYCLATE 100 MG PO TABS: 100.0000 mg | ORAL_TABLET | Freq: Two times a day (BID) | ORAL | 0 refills | Status: DC

## 2019-10-03 MED ORDER — HYDROMORPHONE HCL 4 MG PO TABS: 4.0000 mg | ORAL_TABLET | ORAL | 0 refills | Status: DC | PRN

## 2019-10-03 MED ORDER — IBUPROFEN 800 MG PO TABS: 800.0000 mg | ORAL_TABLET | Freq: Three times a day (TID) | ORAL | 1 refills | Status: DC

## 2019-10-03 NOTE — Progress Notes (Signed)
PRN postop 

## 2019-10-03 NOTE — Progress Notes (Signed)
PRN posotp °

## 2019-10-04 ENCOUNTER — Other Ambulatory Visit: Payer: Self-pay

## 2019-10-04 DIAGNOSIS — M2041 Other hammer toe(s) (acquired), right foot: Secondary | ICD-10-CM

## 2019-10-04 DIAGNOSIS — Z9889 Other specified postprocedural states: Secondary | ICD-10-CM

## 2019-10-04 DIAGNOSIS — M21611 Bunion of right foot: Secondary | ICD-10-CM

## 2019-10-09 ENCOUNTER — Telehealth: Payer: Self-pay | Admitting: Podiatrist

## 2019-10-09 NOTE — Telephone Encounter (Signed)
Patient called requesting a refill of his pain medication.  Please advise if you would like me to call something in for him.  Thanks!!

## 2019-10-10 ENCOUNTER — Other Ambulatory Visit: Payer: Self-pay | Admitting: Sports Medicine

## 2019-10-10 MED ORDER — HYDROMORPHONE HCL 4 MG PO TABS: 4.0000 mg | ORAL_TABLET | ORAL | 0 refills | Status: AC | PRN

## 2019-10-10 NOTE — Progress Notes (Signed)
Refilled pain medication

## 2019-10-11 ENCOUNTER — Encounter: Payer: Medicare Other | Admitting: Podiatry

## 2019-10-15 ENCOUNTER — Other Ambulatory Visit: Payer: Self-pay

## 2019-10-15 ENCOUNTER — Encounter: Payer: Self-pay | Admitting: Podiatry

## 2019-10-15 ENCOUNTER — Ambulatory Visit (INDEPENDENT_AMBULATORY_CARE_PROVIDER_SITE_OTHER): Payer: Medicare Other

## 2019-10-15 ENCOUNTER — Ambulatory Visit (INDEPENDENT_AMBULATORY_CARE_PROVIDER_SITE_OTHER): Payer: Medicare Other | Admitting: Podiatry

## 2019-10-15 DIAGNOSIS — M2041 Other hammer toe(s) (acquired), right foot: Secondary | ICD-10-CM | POA: Diagnosis not present

## 2019-10-15 DIAGNOSIS — Z9889 Other specified postprocedural states: Secondary | ICD-10-CM

## 2019-10-15 MED ORDER — OXYCODONE-ACETAMINOPHEN 10-325 MG PO TABS: 1.0000 | ORAL_TABLET | Freq: Four times a day (QID) | ORAL | 0 refills | Status: DC | PRN

## 2019-10-15 NOTE — Progress Notes (Signed)
Subjective:  Patient ID: Donald [name], male    DOB: [birth year],  MRN: [id number]  Chief Complaint  Patient presents with  . Routine Post Op    POV #1 DOS 10/03/19 BUNIONECTOMY W/OSTEOTOMY RT, HAMMERTOE REPAIR W/CAPSULOTOMY 2ND RT, WEIL DECOMPRESSION OSTEOTOMY 2ND MET RT. HAMMERTOE ARTHROPLASTY 3,4 RT / DR. E. PT.      34 y.o. male returns for post-op check.  Patient is doing well.  He states he has been noncompliant and has been ambulating more than often.  He has not been using the crutches.  He is weightbearing as tolerated with a cam boot.  He says that there is some swelling present.  His pain is controlled with pain medication.  Review of Systems: Negative except as noted in the HPI. Denies N/V/F/Ch.  Past Medical History:  Diagnosis Date  . Asthma   . COPD (chronic obstructive pulmonary disease) (HCC)     Current Outpatient Medications:  .  busPIRone (BUSPAR) 10 MG tablet, busPIRone HCl 10 MG Oral Tablet QTY: 0 tablet Days: 0 Refills: 0  Written: 11/20/18 Patient Instructions:, Disp: , Rfl:  .  cyclobenzaprine (FEXMID) 7.5 MG tablet, Cyclobenzaprine HCl 7.5 MG Oral Tablet QTY: 60 tablet Days: 30 Refills: 0  Written: 11/27/18 Patient Instructions: 1 tablet by mouth twice daily as needed for back pain, Disp: , Rfl:  .  doxycycline (VIBRA-TABS) 100 MG tablet, Take 1 tablet (100 mg total) by mouth 2 (two) times daily., Disp: 20 tablet, Rfl: 0 .  HYDROmorphone (DILAUDID) 4 MG tablet, Take 1 tablet (4 mg total) by mouth every 4 (four) hours as needed for up to 5 days for severe pain., Disp: 30 tablet, Rfl: 0 .  ibuprofen (ADVIL) 800 MG tablet, Take 1 tablet (800 mg total) by mouth 3 (three) times daily., Disp: 90 tablet, Rfl: 1 .  meloxicam (MOBIC) 15 MG tablet, Meloxicam 15 MG Oral Tablet QTY: 90 tablet Days: 90 Refills: 0  Written: 11/20/18 Patient Instructions: once a day, Disp: , Rfl:  .  oxyCODONE-acetaminophen (PERCOCET) 10-325 MG tablet, Take 1 tablet by mouth every 6 (six) hours  as needed for up to 8 days for pain., Disp: 30 tablet, Rfl: 0 .  oxyCODONE-acetaminophen (PERCOCET) 5-325 MG tablet, Take 1-2 tablets by mouth every 6 (six) hours as needed for severe pain., Disp: 30 tablet, Rfl: 0 .  predniSONE (STERAPRED UNI-PAK 21 TAB) 10 MG (21) TBPK tablet, Take 6 tablets on the first day and decrease by 1 tablet each day until finished., Disp: 21 tablet, Rfl: 0 .  pregabalin (LYRICA) 75 MG capsule, Take 75 mg by mouth 2 (two) times daily., Disp: , Rfl:  .  traMADol (ULTRAM) 50 MG tablet, Take 1 tablet (50 mg total) by mouth every 6 (six) hours as needed., Disp: 12 tablet, Rfl: 0  Social History   Tobacco Use  Smoking Status Current Every Day Smoker  . Packs/day: 0.50  . Types: Cigarettes  Smokeless Tobacco Never Used  Tobacco Comment   1/5 ppd    Allergies  Allergen Reactions  . Ceclor  [Cefaclor] Rash and Hives   Objective:  There were no vitals filed for this visit. There is no height or weight on file to calculate BMI. Constitutional Well developed. Well nourished.  Vascular Foot warm and well perfused. Capillary refill normal to all digits.   Neurologic Normal speech. Oriented to person, place, and time. Epicritic sensation to light touch grossly present bilaterally.  Dermatologic Skin healing well without signs of infection. Skin edges  well coapted without signs of infection.  Orthopedic: Tenderness to palpation noted about the surgical site.   Radiographs: 3 views of skeletally mature adult right foot: Hardware is intact.  No signs of breaking or loosening noted.  No hardware failure noted.  Good correction alignment noted. Assessment:   1. Hammertoe of second toe of right foot   2. S/P foot surgery, right    Plan:  Patient was evaluated and treated and all questions answered.  S/p foot surgery right -Progressing as expected post-operatively. -XR: See above -WB Status: Nonweightbearing in cam walker with crutches -Sutures: Intact.  No signs  of dehiscence noted.  No clinical signs of infection noted. -Medications: None -Foot redressed.  No follow-ups on file.

## 2019-10-18 ENCOUNTER — Encounter: Payer: Medicare Other | Admitting: Podiatry

## 2019-10-19 ENCOUNTER — Other Ambulatory Visit: Payer: Self-pay | Admitting: Podiatry

## 2019-10-19 MED ORDER — HYDROMORPHONE HCL 4 MG PO TABS: 4.0000 mg | ORAL_TABLET | Freq: Four times a day (QID) | ORAL | 0 refills | Status: DC | PRN

## 2019-10-19 NOTE — Progress Notes (Signed)
PRN pain 

## 2019-10-24 ENCOUNTER — Ambulatory Visit (INDEPENDENT_AMBULATORY_CARE_PROVIDER_SITE_OTHER): Payer: Medicare HMO

## 2019-10-24 ENCOUNTER — Other Ambulatory Visit: Payer: Self-pay

## 2019-10-24 ENCOUNTER — Ambulatory Visit (INDEPENDENT_AMBULATORY_CARE_PROVIDER_SITE_OTHER): Payer: Medicare HMO | Admitting: Podiatry

## 2019-10-24 DIAGNOSIS — M2041 Other hammer toe(s) (acquired), right foot: Secondary | ICD-10-CM | POA: Diagnosis not present

## 2019-10-24 DIAGNOSIS — Z9889 Other specified postprocedural states: Secondary | ICD-10-CM

## 2019-10-24 DIAGNOSIS — S92354A Nondisplaced fracture of fifth metatarsal bone, right foot, initial encounter for closed fracture: Secondary | ICD-10-CM

## 2019-10-24 MED ORDER — HYDROMORPHONE HCL 4 MG PO TABS: 4.0000 mg | ORAL_TABLET | Freq: Four times a day (QID) | ORAL | 0 refills | Status: DC | PRN

## 2019-10-25 ENCOUNTER — Encounter: Payer: Medicare Other | Admitting: Podiatry

## 2019-10-29 ENCOUNTER — Encounter: Payer: Self-pay | Admitting: Podiatry

## 2019-10-29 NOTE — Progress Notes (Signed)
Subjective:  Patient ID: Donald [name], male    DOB: 09/16/1985,  MRN: [id number]  Chief Complaint  Patient presents with  . Routine Post Op    POV #2 DOS 10/03/19 BUNIONECTOMY W/OSTEOTOMY RT, HAMMERTOE REPAIR W/CAPSULOTOMY 2ND RT, WEIL DECOMPRESSION OSTEOTOMY 2ND MET RT. HAMMERTOE ARTHROPLASTY 3,4 RT    34 y.o. male presents with the above complaint.  Patient presents with listed above procedure status post that was done by Dr. Amalia Hailey.  Patient states he had an acute fall which has caused him to have a lot of pain and then patient states that he may have heard a snap possible fracture.  Patient states is painful to walk on.  He denies any other acute complaints he is ambulating with a cam boot despite the instruction of being nonweightbearing.  He has been noncompliant with his treatment options as set by Dr. Amalia Hailey.   Review of Systems: Negative except as noted in the HPI. Denies N/V/F/Ch.  Past Medical History:  Diagnosis Date  . Asthma   . COPD (chronic obstructive pulmonary disease) (HCC)     Current Outpatient Medications:  .  busPIRone (BUSPAR) 10 MG tablet, busPIRone HCl 10 MG Oral Tablet QTY: 0 tablet Days: 0 Refills: 0  Written: 11/20/18 Patient Instructions:, Disp: , Rfl:  .  cyclobenzaprine (FEXMID) 7.5 MG tablet, Cyclobenzaprine HCl 7.5 MG Oral Tablet QTY: 60 tablet Days: 30 Refills: 0  Written: 11/27/18 Patient Instructions: 1 tablet by mouth twice daily as needed for back pain, Disp: , Rfl:  .  doxycycline (VIBRA-TABS) 100 MG tablet, Take 1 tablet (100 mg total) by mouth 2 (two) times daily., Disp: 20 tablet, Rfl: 0 .  HYDROmorphone (DILAUDID) 4 MG tablet, Take 1 tablet (4 mg total) by mouth every 6 (six) hours as needed for severe pain., Disp: 28 tablet, Rfl: 0 .  ibuprofen (ADVIL) 800 MG tablet, Take 1 tablet (800 mg total) by mouth 3 (three) times daily., Disp: 90 tablet, Rfl: 1 .  meloxicam (MOBIC) 15 MG tablet, Meloxicam 15 MG Oral Tablet QTY: 90 tablet Days: 90 Refills: 0   Written: 11/20/18 Patient Instructions: once a day, Disp: , Rfl:  .  predniSONE (STERAPRED UNI-PAK 21 TAB) 10 MG (21) TBPK tablet, Take 6 tablets on the first day and decrease by 1 tablet each day until finished., Disp: 21 tablet, Rfl: 0 .  pregabalin (LYRICA) 75 MG capsule, Take 75 mg by mouth 2 (two) times daily., Disp: , Rfl:   Social History   Tobacco Use  Smoking Status Current Every Day Smoker  . Packs/day: 0.50  . Types: Cigarettes  Smokeless Tobacco Never Used  Tobacco Comment   1/5 ppd    Allergies  Allergen Reactions  . Ceclor  [Cefaclor] Rash and Hives   Objective:  There were no vitals filed for this visit. There is no height or weight on file to calculate BMI. Constitutional Well developed. Well nourished.  Vascular Dorsalis pedis pulses palpable bilaterally. Posterior tibial pulses palpable bilaterally. Capillary refill normal to all digits.  No cyanosis or clubbing noted. Pedal hair growth normal.  Neurologic Normal speech. Oriented to person, place, and time. Epicritic sensation to light touch grossly present bilaterally.  Dermatologic Nails well groomed and normal in appearance. No open wounds. No skin lesions.  Orthopedic:  Pain on palpation to the right lateral fifth metatarsal.  Pain with range of motion of the digit.  No pain at surgical incision sites.   Radiographs: 3 views of skeletally mature adult right foot:  Radiolucent line noted at the head of the fifth metatarsal.  The capital fragment appears to be mildly displaced without any angulation or rotation.  Mild shortening of the digit noted.  Otherwise in good alignment.  All the other hardware intact without any signs of breaking or loosening. Assessment:   1. Hammertoe of second toe of right foot   2. S/P foot surgery, right   3. Closed nondisplaced fracture of fifth metatarsal bone of right foot, initial encounter    Plan:  Patient was evaluated and treated and all questions answered.  S/p  foot surgery right -Progressing as expected post-operatively. -XR: See above -WB Status: Nonweightbearing in cam walker with crutches -Sutures: Intact.  No signs of dehiscence noted.  No clinical signs of infection noted. -Medications:  Dilaudid 4 mg tablets -Foot redressed  Right fifth metatarsal head fracture nondisplaced -I explained patient the etiology of fracture likely due to the traumatic event that happened after falling.  However the capital fragment appears to be in good alignment without gross dislocation therefore I believe patient will benefit from conservative care as opposed to surgical care.  He will continue to be nonweightbearing to allow for the fracture to heal appropriately.   -Continue remaining nonweightbearing with a knee scooter and a cam walker/crutches.    No follow-ups on file.

## 2019-11-01 ENCOUNTER — Ambulatory Visit (INDEPENDENT_AMBULATORY_CARE_PROVIDER_SITE_OTHER): Payer: Medicare HMO | Admitting: Podiatry

## 2019-11-01 ENCOUNTER — Ambulatory Visit: Payer: Medicare Other | Admitting: Podiatry

## 2019-11-01 ENCOUNTER — Other Ambulatory Visit: Payer: Self-pay

## 2019-11-01 VITALS — BP 144/97 | HR 67 | Temp 98.3°F

## 2019-11-01 DIAGNOSIS — Z9889 Other specified postprocedural states: Secondary | ICD-10-CM

## 2019-11-01 MED ORDER — OXYCODONE-ACETAMINOPHEN 5-325 MG PO TABS: 1.0000 | ORAL_TABLET | Freq: Four times a day (QID) | ORAL | 0 refills | Status: DC | PRN

## 2019-11-01 MED ORDER — DOXYCYCLINE HYCLATE 100 MG PO TABS: 100.0000 mg | ORAL_TABLET | Freq: Two times a day (BID) | ORAL | 0 refills | Status: DC

## 2019-11-06 ENCOUNTER — Other Ambulatory Visit: Payer: Self-pay | Admitting: Podiatry

## 2019-11-06 ENCOUNTER — Telehealth: Payer: Self-pay | Admitting: Podiatry

## 2019-11-06 MED ORDER — HYDROMORPHONE HCL 2 MG PO TABS: 4.0000 mg | ORAL_TABLET | Freq: Four times a day (QID) | ORAL | 0 refills | Status: DC | PRN

## 2019-11-06 NOTE — Progress Notes (Signed)
Patient called back to follow up on request. Refilled dilaudid. Advised he needs to see Dr. Logan Bores tomorrow and advised to keep taking his doxycycline.

## 2019-11-06 NOTE — Telephone Encounter (Signed)
Pt was moving around in kitchen and slammed his toe into counter. Pins got shoved up and he is in severe pain. Please send over pain medication. Does not want Percocet, pt stated its not strong enough and he has to double up on them

## 2019-11-08 ENCOUNTER — Ambulatory Visit (INDEPENDENT_AMBULATORY_CARE_PROVIDER_SITE_OTHER): Payer: Medicare HMO | Admitting: Podiatry

## 2019-11-08 ENCOUNTER — Ambulatory Visit (INDEPENDENT_AMBULATORY_CARE_PROVIDER_SITE_OTHER): Payer: Medicare HMO

## 2019-11-08 ENCOUNTER — Encounter: Payer: Self-pay | Admitting: Podiatry

## 2019-11-08 ENCOUNTER — Other Ambulatory Visit: Payer: Self-pay

## 2019-11-08 DIAGNOSIS — Z9889 Other specified postprocedural states: Secondary | ICD-10-CM

## 2019-11-08 DIAGNOSIS — M2041 Other hammer toe(s) (acquired), right foot: Secondary | ICD-10-CM

## 2019-11-08 DIAGNOSIS — M21611 Bunion of right foot: Secondary | ICD-10-CM

## 2019-11-08 MED ORDER — HYDROMORPHONE HCL 4 MG PO TABS: 4.0000 mg | ORAL_TABLET | Freq: Four times a day (QID) | ORAL | 0 refills | Status: DC | PRN

## 2019-11-08 NOTE — Progress Notes (Signed)
   Subjective: 34 y.o. male status post forefoot reconstructive surgery right.  DOS: 10/03/2019.  Patient had surgery to his left bunion and second hammertoe on 05/09/2019.  Patient continues to have some pain and tenderness to the most recent surgical foot.  He has been wearing the surgical shoe and using crutches for assistance.  He has also been taking the oral antibiotics as prescribed so he states.  The Dilaudid helps to mitigate his postoperative pain.  No new complaints at this time  Past Medical History:  Diagnosis Date  . Asthma   . COPD (chronic obstructive pulmonary disease) (HCC)    Objective/Physical Exam Neurovascular status intact.  Heavy edema noted.  The foot is somewhat dirty with some malodor, however it is more of a malodor due to foot hygiene versus infection. No active bleeding noted.  There is some surgical wound dehiscence to 2 of the incision sites of the forefoot.  These appear somewhat superficial in nature.  There is no exposed hardware.  No exposed tendon or bone.    Radiographic Exam:  Orthopedic hardware and osteotomies sites appear to be stable with routine healing.  There is some movement of the hardware noted likely due to excessive weightbearing.  The distal orthopedic screw to the first metatarsal does appear to have changed direction postsurgically however it is intact and does not appear to be protruding or backing out.  Some of the percutaneous fixation K wires do have some bending however there is nothing broken in the foot.  Assessment: 1. s/p right reconstructive forefoot surgery. DOS: 10/03/2019   Plan of Care:  1. Patient was evaluated. X-Rays reviewed  2.  Betadine soaked wet to dry dressings changed today.  Keep clean dry and intact x1 week 3.  Continue minimal weightbearing in the postsurgical shoe with the assistance of crutches 4.  Continue the oral antibiotics as prescribed 5.  Again today I stressed the importance of staying off of the foot and  elevating it to reduce the edema and improve the postsurgical outcome.  Patient understands.  I do suspect that the patient is weightbearing or up and moving significantly on the foot causing the increased edema and bending of the K wires  6.  Return to clinic in 1 week  Felecia Shelling, DPM [facility]  Dr. Felecia Shelling, DPM    [address]                                        [place], [place] [address]                Office [phone]  Fax [phone]

## 2019-11-10 NOTE — Progress Notes (Signed)
   Subjective:  Patient presents today status post right forefoot reconstruction. DOS: 10/03/2019.  Patient continues to have swelling and tenderness to the surgical forefoot.  Patient appears to have somewhat poor hygiene, although he has the ability to heal his left surgical forefoot.  He continues to have pain and tenderness associated with the foot.  He continues to take Dilaudid for the pain.  He has been weightbearing in the cam boot and there is concerned that he is doing a significant amount of weightbearing since the cam boot is very dirty and warn.  No new complaints at this time  Past Medical History:  Diagnosis Date  . Asthma   . COPD (chronic obstructive pulmonary disease) (HCC)       Objective/Physical Exam Capillary refill within normal limits.  Pulses palpable.  Epicritic and protective threshold intact.  Heavy edema noted to the surgical forefoot.  Staples are intact.  No malodor noted.  Assessment: 1. s/p right forefoot reconstruction. DOS: 10/03/2019   Plan of Care:  1. Patient was evaluated. X-rays reviewed that were taken last visit.  I explained to the patient that he did not have an acute fracture.  Apparently he was diagnosed with an acute fracture by Dr. Allena Katz last visit here in the office and I was out of the office.  Compared to prior x-rays taken 12/11/2018 the cortical irregularities of the fifth metatarsal on the same and there has been no change.  This is either a bony abnormality or evidence of the chronic old fracture that is healed. 2.  Dressings changed today.  Keep clean dry intact x1 week 3.  Sutures and staples were removed today prior to dressing change 4.  Patient has discontinued the postsurgical cam boot.  Continue minimal weightbearing in the postsurgical shoe that was dispensed last visit.  I stressed the importance of the patient reducing activity and elevating the foot as much as possible.  I do suspect the patient has been overly active given the  presentation today 5.  Prescription for Percocet 5/325 mg 6.  Prescription for doxycycline 100 mg 2 times daily x10 days 7.  Return to clinic in 1 week   Felecia Shelling, DPM [facility]  Dr. Felecia Shelling, DPM    [address]                                        [place], [place] [address]                Office [phone]  Fax [phone]

## 2019-11-15 ENCOUNTER — Encounter: Payer: Self-pay | Admitting: Podiatry

## 2019-11-15 ENCOUNTER — Other Ambulatory Visit: Payer: Self-pay

## 2019-11-15 ENCOUNTER — Ambulatory Visit (INDEPENDENT_AMBULATORY_CARE_PROVIDER_SITE_OTHER): Payer: Medicare HMO | Admitting: Podiatry

## 2019-11-15 DIAGNOSIS — M21611 Bunion of right foot: Secondary | ICD-10-CM

## 2019-11-15 DIAGNOSIS — M2041 Other hammer toe(s) (acquired), right foot: Secondary | ICD-10-CM

## 2019-11-15 DIAGNOSIS — Z9889 Other specified postprocedural states: Secondary | ICD-10-CM

## 2019-11-15 MED ORDER — HYDROMORPHONE HCL 4 MG PO TABS: 4.0000 mg | ORAL_TABLET | Freq: Four times a day (QID) | ORAL | 0 refills | Status: DC | PRN

## 2019-11-15 NOTE — Progress Notes (Signed)
   Subjective: 34 y.o. male status post forefoot reconstructive surgery right.  DOS: 10/03/2019.  Patient had surgery to his left bunion and second hammertoe on 05/09/2019.  Patient states that he has noticed improvement with the swelling and erythema to the foot.  He has been taking his oral antibiotics as prescribed.  No new complaints at this time.  Past Medical History:  Diagnosis Date  . Asthma   . COPD (chronic obstructive pulmonary disease) (HCC)    Objective/Physical Exam Neurovascular status intact.  Heavy edema noted.  No malodor noted today.  No active bleeding noted.  There is some surgical wound dehiscence to 2 of the incision sites of the forefoot.  These appear somewhat superficial in nature and improved since last visit.  There is no exposed hardware.  No exposed tendon or bone.    Assessment: 1. s/p right reconstructive forefoot surgery. DOS: 10/03/2019   Plan of Care:  1. Patient was evaluated. X-Rays reviewed  2.  Percutaneous fixation pins removed today. 3.  Recommend antibiotic ointment with an Ace wrap daily 4.  Continue minimal weightbearing and postsurgical shoe 5.  Return to clinic in 2 weeks for follow-up x-ray 6.  Complete oral antibiotics as prescribed  Felecia Shelling, DPM [facility]  Dr. Felecia Shelling, DPM    [address]                                        [place], [place] [address]                Office [phone]  Fax [phone]

## 2019-11-22 ENCOUNTER — Other Ambulatory Visit: Payer: Self-pay | Admitting: Podiatry

## 2019-11-23 ENCOUNTER — Telehealth: Payer: Self-pay | Admitting: Sports Medicine

## 2019-11-23 NOTE — Telephone Encounter (Signed)
Patient called answering service stating that he stubbed his toe and is out of pain medication. I advised patient that our office does not refill pain meds on the weekend. Recommended patient to rest, ice, elevate and take OTC pain meds. If pain fails to improve may go to Urgent care. -Dr. Marylene Land

## 2019-11-27 ENCOUNTER — Telehealth: Payer: Self-pay | Admitting: *Deleted

## 2019-11-27 NOTE — Telephone Encounter (Signed)
Judeth Cornfield w/ [facility] wanted to inform physician that patient does not wish to receive nursing services any longer, he thinks that the wound has healed. She encouraged him to keep his scheduled appointment  on 11/29/19

## 2019-11-29 ENCOUNTER — Ambulatory Visit (INDEPENDENT_AMBULATORY_CARE_PROVIDER_SITE_OTHER): Payer: Medicare HMO | Admitting: Podiatry

## 2019-11-29 ENCOUNTER — Telehealth: Payer: Self-pay | Admitting: Podiatry

## 2019-11-29 ENCOUNTER — Encounter: Payer: Self-pay | Admitting: *Deleted

## 2019-11-29 ENCOUNTER — Encounter: Payer: Self-pay | Admitting: Podiatry

## 2019-11-29 ENCOUNTER — Ambulatory Visit (INDEPENDENT_AMBULATORY_CARE_PROVIDER_SITE_OTHER): Payer: Medicare HMO

## 2019-11-29 ENCOUNTER — Other Ambulatory Visit: Payer: Self-pay

## 2019-11-29 DIAGNOSIS — Z9889 Other specified postprocedural states: Secondary | ICD-10-CM | POA: Diagnosis not present

## 2019-11-29 DIAGNOSIS — M2041 Other hammer toe(s) (acquired), right foot: Secondary | ICD-10-CM

## 2019-11-29 MED ORDER — OXYCODONE-ACETAMINOPHEN 5-325 MG PO TABS: 1.0000 | ORAL_TABLET | Freq: Four times a day (QID) | ORAL | 0 refills | Status: DC | PRN

## 2019-11-29 MED ORDER — HYDROMORPHONE HCL 4 MG PO TABS: 4.0000 mg | ORAL_TABLET | Freq: Four times a day (QID) | ORAL | 0 refills | Status: DC | PRN

## 2019-11-29 NOTE — Addendum Note (Signed)
Addended by: Felecia Shelling on: 11/29/2019 06:51 PM   Modules accepted: Orders

## 2019-11-29 NOTE — Telephone Encounter (Signed)
Patient needs to have medication called in. [facility] is down.

## 2019-11-29 NOTE — Progress Notes (Signed)
   Subjective: 34 y.o. male status post forefoot reconstructive surgery right.  DOS: 10/03/2019.  Patient had surgery to his left bunion and second hammertoe on 05/09/2019.  Patient states that he is feeling much better.  The foot has improved significantly and the swelling has gone down.  He has been weightbearing in the postsurgical shoe as instructed.  No new complaints at this time  Past Medical History:  Diagnosis Date  . Asthma   . COPD (chronic obstructive pulmonary disease) (HCC)    Objective/Physical Exam Neurovascular status intact.  Improved edema noted.  No malodor noted today.  No active bleeding noted.  Surgical wound dehiscence to the surgical foot has healed.  Complete reepithelialization has occurred.    Radiographic exam: Orthopedic screws intact.  Routine healing noted.  No significant change since last x-rays taken on 11/08/2019.  Postsurgical changes noted however there is stable with minimal change  Assessment: 1. s/p right reconstructive forefoot surgery. DOS: 10/03/2019   Plan of Care:  1. Patient was evaluated. X-Rays reviewed  2.  Continue weightbearing in the postsurgical shoe x4 weeks 3.  Compression ankle sleeve dispensed 4.  Return to clinic in 4 weeks  Felecia Shelling, DPM [facility]  Dr. Felecia Shelling, DPM    [address]                                        [place], [place] [address]                Office [phone]  Fax [phone]

## 2019-12-12 ENCOUNTER — Telehealth: Payer: Self-pay | Admitting: Podiatry

## 2019-12-12 NOTE — Telephone Encounter (Signed)
Crystal from [facility] called In reference to patients orders for home health care, they were given orders verbally but they need signatures on the current orders that were faxed over. Please advise   Crystal [phone]

## 2019-12-27 ENCOUNTER — Encounter: Payer: Medicare HMO | Admitting: Podiatry

## 2020-01-07 ENCOUNTER — Other Ambulatory Visit: Payer: Self-pay

## 2020-01-07 ENCOUNTER — Ambulatory Visit (INDEPENDENT_AMBULATORY_CARE_PROVIDER_SITE_OTHER): Payer: Medicare HMO | Admitting: Podiatry

## 2020-01-07 DIAGNOSIS — M21611 Bunion of right foot: Secondary | ICD-10-CM

## 2020-01-07 DIAGNOSIS — M2041 Other hammer toe(s) (acquired), right foot: Secondary | ICD-10-CM

## 2020-01-07 DIAGNOSIS — Z9889 Other specified postprocedural states: Secondary | ICD-10-CM

## 2020-01-07 NOTE — Progress Notes (Signed)
   Subjective: 34 y.o. male status post forefoot reconstructive surgery right.  DOS: 10/03/2019.  Patient had surgery to his left bunion and second hammertoe on 05/09/2019.  Patient states that he is doing well.  He has returned to work in [employer] and is very hard on his feet.  He does have some aches and pains throughout the day which is normal given his line of work.  Otherwise he has no new complaints at this time  Past Medical History:  Diagnosis Date  . Asthma   . COPD (chronic obstructive pulmonary disease) (HCC)    Objective/Physical Exam Neurovascular status intact.  Improved edema noted.  No malodor noted today.  No active bleeding noted.  Incisions completely healed..  Complete reepithelialization has occurred.    Assessment: 1. s/p right reconstructive forefoot surgery. DOS: 10/03/2019   Plan of Care:  1. Patient was evaluated.  2.  Patient may now resume full activity no restrictions 3.  Recommend good supportive shoe gear 4.  Continue compression ankle sleeves bilateral as needed  Felecia Shelling, DPM [facility]  Dr. Felecia Shelling, DPM    [address] [place], [place] [address]                Office [phone]  Fax [phone]

## 2020-05-21 ENCOUNTER — Observation Stay
Admission: EM | Admit: 2020-05-21 | Discharge: 2020-05-23 | Disposition: A | Discharge status: Home / Self Care | Payer: Medicare HMO | Attending: Internal Medicine | Admitting: Internal Medicine

## 2020-05-21 ENCOUNTER — Other Ambulatory Visit: Payer: Self-pay

## 2020-05-21 ENCOUNTER — Encounter: Payer: Self-pay | Admitting: Radiology

## 2020-05-21 ENCOUNTER — Emergency Department: Payer: Medicare HMO

## 2020-05-21 DIAGNOSIS — S2242XA Multiple fractures of ribs, left side, initial encounter for closed fracture: Principal | ICD-10-CM

## 2020-05-21 DIAGNOSIS — J45909 Unspecified asthma, uncomplicated: Secondary | ICD-10-CM | POA: Insufficient documentation

## 2020-05-21 DIAGNOSIS — S7012XA Contusion of left thigh, initial encounter: Secondary | ICD-10-CM | POA: Diagnosis present

## 2020-05-21 DIAGNOSIS — D72829 Elevated white blood cell count, unspecified: Secondary | ICD-10-CM

## 2020-05-21 DIAGNOSIS — Z20822 Contact with and (suspected) exposure to covid-19: Secondary | ICD-10-CM | POA: Diagnosis not present

## 2020-05-21 DIAGNOSIS — S299XXA Unspecified injury of thorax, initial encounter: Secondary | ICD-10-CM | POA: Diagnosis present

## 2020-05-21 DIAGNOSIS — Y92410 Unspecified street and highway as the place of occurrence of the external cause: Secondary | ICD-10-CM | POA: Insufficient documentation

## 2020-05-21 DIAGNOSIS — R52 Pain, unspecified: Secondary | ICD-10-CM

## 2020-05-21 DIAGNOSIS — F17210 Nicotine dependence, cigarettes, uncomplicated: Secondary | ICD-10-CM | POA: Diagnosis not present

## 2020-05-21 DIAGNOSIS — R780 Finding of alcohol in blood: Secondary | ICD-10-CM

## 2020-05-21 DIAGNOSIS — J449 Chronic obstructive pulmonary disease, unspecified: Secondary | ICD-10-CM | POA: Diagnosis present

## 2020-05-21 DIAGNOSIS — B182 Chronic viral hepatitis C: Secondary | ICD-10-CM | POA: Diagnosis present

## 2020-05-21 LAB — CBC WITH DIFFERENTIAL/PLATELET
Abs Immature Granulocytes: 0.08 10*3/uL — ABNORMAL HIGH (ref 0.00–0.07)
Basophils Absolute: 0.1 10*3/uL (ref 0.0–0.1)
Basophils Relative: 0 %
Eosinophils Absolute: 0 10*3/uL (ref 0.0–0.5)
Eosinophils Relative: 0 %
HCT: 44.2 % (ref 39.0–52.0)
Hemoglobin: 15.5 g/dL (ref 13.0–17.0)
Immature Granulocytes: 0 %
Lymphocytes Relative: 8 %
Lymphs Abs: 1.4 10*3/uL (ref 0.7–4.0)
MCH: 33.4 pg (ref 26.0–34.0)
MCHC: 35.1 g/dL (ref 30.0–36.0)
MCV: 95.3 fL (ref 80.0–100.0)
Monocytes Absolute: 1.5 10*3/uL — ABNORMAL HIGH (ref 0.1–1.0)
Monocytes Relative: 8 %
Neutro Abs: 14.9 10*3/uL — ABNORMAL HIGH (ref 1.7–7.7)
Neutrophils Relative %: 84 %
Platelets: 279 10*3/uL (ref 150–400)
RBC: 4.64 MIL/uL (ref 4.22–5.81)
RDW: 12.4 % (ref 11.5–15.5)
WBC: 18 10*3/uL — ABNORMAL HIGH (ref 4.0–10.5)
nRBC: 0 % (ref 0.0–0.2)

## 2020-05-21 LAB — COMPREHENSIVE METABOLIC PANEL
ALT: 16 U/L (ref 0–44)
AST: 32 U/L (ref 15–41)
Albumin: 4.3 g/dL (ref 3.5–5.0)
Alkaline Phosphatase: 58 U/L (ref 38–126)
Anion gap: 10 (ref 5–15)
BUN: 16 mg/dL (ref 6–20)
CO2: 21 mmol/L — ABNORMAL LOW (ref 22–32)
Calcium: 9.2 mg/dL (ref 8.9–10.3)
Chloride: 105 mmol/L (ref 98–111)
Creatinine, Ser: 0.96 mg/dL (ref 0.61–1.24)
GFR, Estimated: >60 mL/min (ref >60)
Glucose, Bld: 108 mg/dL — ABNORMAL HIGH (ref 70–99)
Potassium: 3.6 mmol/L (ref 3.5–5.1)
Sodium: 136 mmol/L (ref 135–145)
Total Bilirubin: 0.6 mg/dL (ref 0.3–1.2)
Total Protein: 6.5 g/dL (ref 6.5–8.1)

## 2020-05-21 LAB — PROTIME-INR
INR: 1 (ref 0.8–1.2)
Prothrombin Time: 12.5 seconds (ref 11.4–15.2)

## 2020-05-21 LAB — TYPE AND SCREEN
ABO/RH(D): O POS
Antibody Screen: NEGATIVE

## 2020-05-21 LAB — ETHANOL: Alcohol, Ethyl (B): 87 mg/dL — ABNORMAL HIGH (ref <10)

## 2020-05-21 MED ORDER — ONDANSETRON HCL 4 MG/2ML IJ SOLN
4.0000 mg | Freq: Once | INTRAMUSCULAR | Status: AC
  Administered 2020-05-21: 4 mg via INTRAVENOUS
  Filled 2020-05-21: qty 2

## 2020-05-21 MED ORDER — FENTANYL CITRATE (PF) 100 MCG/2ML IJ SOLN
75.0000 ug | Freq: Once | INTRAMUSCULAR | Status: AC
Start: 2020-05-21 — End: 2020-05-21
  Administered 2020-05-21: 75 ug via INTRAVENOUS
  Filled 2020-05-21: qty 2

## 2020-05-21 MED ORDER — LACTATED RINGERS IV BOLUS
1000.0000 mL | Freq: Once | INTRAVENOUS | Status: AC
  Administered 2020-05-21: 1000 mL via INTRAVENOUS

## 2020-05-21 MED ORDER — IOHEXOL 300 MG/ML  SOLN
100.0000 mL | Freq: Once | INTRAMUSCULAR | Status: AC | PRN
  Administered 2020-05-21: 100 mL via INTRAVENOUS

## 2020-05-21 MED ORDER — TETANUS-DIPHTH-ACELL PERTUSSIS 5-2.5-18.5 LF-MCG/0.5 IM SUSY
0.5000 mL | PREFILLED_SYRINGE | Freq: Once | INTRAMUSCULAR | Status: DC
  Filled 2020-05-21 (×2): qty 0.5
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
## 2020-05-22 ENCOUNTER — Encounter: Payer: Self-pay | Admitting: Internal Medicine

## 2020-05-22 DIAGNOSIS — S2242XA Multiple fractures of ribs, left side, initial encounter for closed fracture: Secondary | ICD-10-CM

## 2020-05-22 DIAGNOSIS — D72829 Elevated white blood cell count, unspecified: Secondary | ICD-10-CM

## 2020-05-22 DIAGNOSIS — R780 Finding of alcohol in blood: Secondary | ICD-10-CM

## 2020-05-22 DIAGNOSIS — S7012XA Contusion of left thigh, initial encounter: Secondary | ICD-10-CM | POA: Diagnosis not present

## 2020-05-22 LAB — CBC
HCT: 40.1 % (ref 39.0–52.0)
HCT: 41.6 % (ref 39.0–52.0)
Hemoglobin: 14.2 g/dL (ref 13.0–17.0)
Hemoglobin: 14.1 g/dL (ref 13.0–17.0)
MCH: 33.9 pg (ref 26.0–34.0)
MCH: 32.7 pg (ref 26.0–34.0)
MCHC: 35.4 g/dL (ref 30.0–36.0)
MCHC: 33.9 g/dL (ref 30.0–36.0)
MCV: 95.7 fL (ref 80.0–100.0)
MCV: 96.5 fL (ref 80.0–100.0)
Platelets: 227 10*3/uL (ref 150–400)
Platelets: 216 10*3/uL (ref 150–400)
RBC: 4.19 MIL/uL — ABNORMAL LOW (ref 4.22–5.81)
RBC: 4.31 MIL/uL (ref 4.22–5.81)
RDW: 12.7 % (ref 11.5–15.5)
RDW: 12.6 % (ref 11.5–15.5)
WBC: 11.2 10*3/uL — ABNORMAL HIGH (ref 4.0–10.5)
WBC: 10.6 10*3/uL — ABNORMAL HIGH (ref 4.0–10.5)
nRBC: 0 % (ref 0.0–0.2)
nRBC: 0 % (ref 0.0–0.2)

## 2020-05-22 LAB — HIV ANTIBODY (ROUTINE TESTING W REFLEX): HIV Screen 4th Generation wRfx: NONREACTIVE

## 2020-05-22 LAB — RESP PANEL BY RT-PCR (FLU A&B, COVID) ARPGX2
Influenza A by PCR: NEGATIVE
Influenza B by PCR: NEGATIVE
SARS Coronavirus 2 by RT PCR: NEGATIVE

## 2020-05-22 LAB — PHOSPHORUS: Phosphorus: 4.7 mg/dL — ABNORMAL HIGH (ref 2.5–4.6)

## 2020-05-22 LAB — MAGNESIUM: Magnesium: 2.1 mg/dL (ref 1.7–2.4)

## 2020-05-22 LAB — COMPREHENSIVE METABOLIC PANEL
ALT: 14 U/L (ref 0–44)
AST: 27 U/L (ref 15–41)
Albumin: 3.6 g/dL (ref 3.5–5.0)
Alkaline Phosphatase: 55 U/L (ref 38–126)
Anion gap: 7 (ref 5–15)
BUN: 16 mg/dL (ref 6–20)
CO2: 24 mmol/L (ref 22–32)
Calcium: 9.1 mg/dL (ref 8.9–10.3)
Chloride: 108 mmol/L (ref 98–111)
Creatinine, Ser: 0.83 mg/dL (ref 0.61–1.24)
GFR, Estimated: >60 mL/min (ref >60)
Glucose, Bld: 106 mg/dL — ABNORMAL HIGH (ref 70–99)
Potassium: 3.8 mmol/L (ref 3.5–5.1)
Sodium: 139 mmol/L (ref 135–145)
Total Bilirubin: 0.6 mg/dL (ref 0.3–1.2)
Total Protein: 5.8 g/dL — ABNORMAL LOW (ref 6.5–8.1)

## 2020-05-22 MED ORDER — LIDOCAINE 5 % EX PTCH
1.0000 | MEDICATED_PATCH | CUTANEOUS | Status: DC
  Administered 2020-05-22: 10:00:00 1 via TRANSDERMAL
  Filled 2020-05-22 (×2): qty 1

## 2020-05-22 MED ORDER — LORAZEPAM 2 MG/ML IJ SOLN: 1.0000 mg | INTRAMUSCULAR | Status: DC | PRN

## 2020-05-22 MED ORDER — METHOCARBAMOL 750 MG PO TABS
750.0000 mg | ORAL_TABLET | Freq: Three times a day (TID) | ORAL | Status: DC
  Administered 2020-05-22 (×3): 750 mg via ORAL
  Filled 2020-05-22 (×4): qty 1

## 2020-05-22 MED ORDER — THIAMINE HCL 100 MG/ML IJ SOLN
100.0000 mg | Freq: Every day | INTRAMUSCULAR | Status: DC
  Filled 2020-05-22: qty 2

## 2020-05-22 MED ORDER — ACETAMINOPHEN 650 MG RE SUPP: 650.0000 mg | Freq: Four times a day (QID) | RECTAL | Status: DC | PRN

## 2020-05-22 MED ORDER — HYDROCODONE-ACETAMINOPHEN 5-325 MG PO TABS
1.0000 | ORAL_TABLET | ORAL | Status: DC | PRN
  Administered 2020-05-22 – 2020-05-23 (×5): 2 via ORAL
  Filled 2020-05-22 (×5): qty 2

## 2020-05-22 MED ORDER — ADULT MULTIVITAMIN W/MINERALS CH
1.0000 | ORAL_TABLET | Freq: Every day | ORAL | Status: DC
  Administered 2020-05-22 – 2020-05-23 (×2): 1 via ORAL
  Filled 2020-05-22 (×2): qty 1

## 2020-05-22 MED ORDER — FOLIC ACID 1 MG PO TABS
1.0000 mg | ORAL_TABLET | Freq: Every day | ORAL | Status: DC
  Administered 2020-05-22 – 2020-05-23 (×2): 1 mg via ORAL
  Filled 2020-05-22 (×2): qty 1

## 2020-05-22 MED ORDER — LORAZEPAM 1 MG PO TABS: 1.0000 mg | ORAL_TABLET | ORAL | Status: DC | PRN

## 2020-05-22 MED ORDER — ONDANSETRON HCL 4 MG/2ML IJ SOLN: 4.0000 mg | Freq: Four times a day (QID) | INTRAMUSCULAR | Status: DC | PRN

## 2020-05-22 MED ORDER — MELATONIN 5 MG PO TABS
5.0000 mg | ORAL_TABLET | Freq: Every day | ORAL | Status: DC
  Administered 2020-05-22: 22:00:00 5 mg via ORAL
  Filled 2020-05-22 (×2): qty 1

## 2020-05-22 MED ORDER — HYDROMORPHONE HCL 1 MG/ML IJ SOLN
0.5000 mg | Freq: Once | INTRAMUSCULAR | Status: AC
Start: 2020-05-22 — End: 2020-05-22
  Administered 2020-05-22: 0.5 mg via INTRAVENOUS
  Filled 2020-05-22: qty 1

## 2020-05-22 MED ORDER — ACETAMINOPHEN 325 MG PO TABS: 650.0000 mg | ORAL_TABLET | Freq: Four times a day (QID) | ORAL | Status: DC | PRN

## 2020-05-22 MED ORDER — ONDANSETRON HCL 4 MG PO TABS: 4.0000 mg | ORAL_TABLET | Freq: Four times a day (QID) | ORAL | Status: DC | PRN

## 2020-05-22 MED ORDER — HYDROMORPHONE HCL 1 MG/ML IJ SOLN
0.5000 mg | INTRAMUSCULAR | Status: DC | PRN
  Administered 2020-05-22 (×3): 1 mg via INTRAVENOUS
  Filled 2020-05-22 (×3): qty 1

## 2020-05-22 MED ORDER — POLYETHYLENE GLYCOL 3350 17 G PO PACK: 17.0000 g | PACK | Freq: Every day | ORAL | Status: DC | PRN

## 2020-05-22 MED ORDER — NICOTINE 14 MG/24HR TD PT24
14.0000 mg | MEDICATED_PATCH | Freq: Every day | TRANSDERMAL | Status: DC
  Administered 2020-05-22 – 2020-05-23 (×2): 14 mg via TRANSDERMAL
  Filled 2020-05-22 (×2): qty 1

## 2020-05-22 MED ORDER — ALBUTEROL SULFATE HFA 108 (90 BASE) MCG/ACT IN AERS
1.0000 | INHALATION_SPRAY | RESPIRATORY_TRACT | Status: DC | PRN
  Filled 2020-05-22: qty 6.7

## 2020-05-22 MED ORDER — THIAMINE HCL 100 MG PO TABS
100.0000 mg | ORAL_TABLET | Freq: Every day | ORAL | Status: DC
  Administered 2020-05-22 – 2020-05-23 (×2): 100 mg via ORAL
  Filled 2020-05-22 (×2): qty 1
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
## 2020-05-22 NOTE — Progress Notes (Signed)
OT Cancellation Note  Patient Details Name: Donald [name] MRN: [id number] DOB: 15-Sep-1985   Cancelled Treatment:    Reason Eval/Treat Not Completed: Patient declined, no reason specified. Consult received, chart reviewed. Pt met at the front of his room with a 2WW. Pt declines OT evaluation, requesting a "walker from the PT." Case manager notified. RN aware. Will re-attempt at later date/time as pt is agreeable.   Hanley Hays, MPH, MS, OTR/L ascom [phone] 05/22/20, 1:56 PM
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
## 2020-05-22 NOTE — ED Provider Notes (Signed)
-----------------------------------------   12:23 AM on 05/22/2020 -----------------------------------------  CT head/cervical spine interpreted per Dr. Chase Picket: 1. No acute intracranial abnormality.  2. No acute fracture or static subluxation of the cervical spine.   CT chest/abdomen/pelvis interpreted per Dr. Kearney Hard: Fractures through the left lateral 6th through 8th ribs. No  pneumothorax.    No acute findings in the abdomen or pelvis.     CT left femur interpreted per Dr. Chilton Si: 1. No acute displaced fracture or dislocation.  2. Large intramuscular hematoma involving the anterior compartment  of the left thigh.  3. Trace suprapatellar joint effusion.   CT T/L spine interpreted per Dr. Chase Picket: 1. No acute fracture or traumatic subluxation of the thoracic or  lumbar spine.  2. Bilateral chronic L3 pars interarticularis defects with unchanged  grade 1 anterolisthesis at L3-4.   Left thigh swollen compared to the right but remains supple, not tense nor pale.  No clinical evidence of compartment syndrome.  Complains of persistent pain; will administer IV Dilaudid.   ----------------------------------------- 12:35 AM on 05/22/2020 -----------------------------------------  Discussed case with orthopedics on-call Dr. Rosita Kea who agrees with admitting patient to our facility for pain control and monitoring left thigh compartment.   Irean Hong, MD 05/22/20 [phone]
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
## 2020-05-23 DIAGNOSIS — S7012XA Contusion of left thigh, initial encounter: Secondary | ICD-10-CM | POA: Diagnosis not present

## 2020-05-23 DIAGNOSIS — S2242XA Multiple fractures of ribs, left side, initial encounter for closed fracture: Secondary | ICD-10-CM | POA: Diagnosis not present

## 2020-05-23 MED ORDER — METHOCARBAMOL 500 MG PO TABS
1000.0000 mg | ORAL_TABLET | Freq: Three times a day (TID) | ORAL | Status: DC
  Administered 2020-05-23: 1000 mg via ORAL
  Filled 2020-05-23 (×3): qty 2

## 2020-05-23 MED ORDER — PREGABALIN 50 MG PO CAPS
100.0000 mg | ORAL_CAPSULE | Freq: Two times a day (BID) | ORAL | Status: DC
  Administered 2020-05-23: 10:00:00 100 mg via ORAL
  Filled 2020-05-23: qty 2

## 2020-05-23 MED ORDER — HYDROCODONE-ACETAMINOPHEN 5-325 MG PO TABS
1.0000 | ORAL_TABLET | ORAL | Status: DC | PRN
  Administered 2020-05-23: 1 via ORAL
  Filled 2020-05-23: qty 1

## 2020-05-23 MED ORDER — METHOCARBAMOL 500 MG PO TABS: 1000.0000 mg | ORAL_TABLET | Freq: Three times a day (TID) | ORAL | 0 refills | Status: DC

## 2020-05-23 MED ORDER — NICOTINE 14 MG/24HR TD PT24: 14.0000 mg | MEDICATED_PATCH | Freq: Every day | TRANSDERMAL | 0 refills | Status: AC

## 2020-05-23 MED ORDER — LIDOCAINE 5 % EX PTCH
2.0000 | MEDICATED_PATCH | CUTANEOUS | Status: DC
  Administered 2020-05-23: 2 via TRANSDERMAL
  Filled 2020-05-23: qty 2

## 2020-05-23 MED ORDER — HYDROCODONE-ACETAMINOPHEN 5-325 MG PO TABS: 1.0000 | ORAL_TABLET | ORAL | 0 refills | Status: DC | PRN

## 2020-05-23 MED ORDER — METHOCARBAMOL 500 MG PO TABS: 1000.0000 mg | ORAL_TABLET | Freq: Three times a day (TID) | ORAL | Status: DC

## 2020-05-23 MED ORDER — LIDOCAINE 5 % EX PTCH: 2.0000 | MEDICATED_PATCH | CUTANEOUS | 0 refills | Status: DC
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
## 2020-05-23 NOTE — Progress Notes (Addendum)
Despite patient up walking yesterday and wanting to leave AMA he is now needing SNF placement per OT. Will consult TOC.  Addendum: patient worked with PT, does not need SNF placement.  Refused shelter offered by TOC.  Will discharge patient JV
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
## 2020-06-27 ENCOUNTER — Other Ambulatory Visit: Payer: Self-pay

## 2020-06-27 DIAGNOSIS — F17210 Nicotine dependence, cigarettes, uncomplicated: Secondary | ICD-10-CM | POA: Diagnosis not present

## 2020-06-27 DIAGNOSIS — J45909 Unspecified asthma, uncomplicated: Secondary | ICD-10-CM | POA: Diagnosis not present

## 2020-06-27 DIAGNOSIS — J449 Chronic obstructive pulmonary disease, unspecified: Secondary | ICD-10-CM | POA: Insufficient documentation

## 2020-06-27 DIAGNOSIS — M25461 Effusion, right knee: Secondary | ICD-10-CM | POA: Diagnosis not present

## 2020-06-27 DIAGNOSIS — M25561 Pain in right knee: Secondary | ICD-10-CM | POA: Diagnosis present
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
## 2020-06-27 NOTE — ED Triage Notes (Signed)
Pt laying on the floor asleep in [place]

## 2020-06-27 NOTE — ED Triage Notes (Signed)
Pt came back in the ER, got his belongings and walked out front door. Pt told EMS that he did not even want to come he just wanted a ride to the gas station. Hospital police followed pt out the door.
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
## 2020-06-28 ENCOUNTER — Encounter: Payer: Self-pay | Admitting: Radiology

## 2020-06-28 ENCOUNTER — Emergency Department: Payer: Medicare HMO

## 2020-06-28 ENCOUNTER — Emergency Department
Admission: EM | Admit: 2020-06-28 | Discharge: 2020-06-28 | Disposition: A | Discharge status: Home / Self Care | Payer: Medicare HMO | Attending: Emergency Medicine | Admitting: Emergency Medicine

## 2020-06-28 DIAGNOSIS — M25561 Pain in right knee: Secondary | ICD-10-CM

## 2020-06-28 DIAGNOSIS — M25461 Effusion, right knee: Secondary | ICD-10-CM | POA: Diagnosis not present

## 2020-06-28 MED ORDER — KETOROLAC TROMETHAMINE 60 MG/2ML IM SOLN
30.0000 mg | Freq: Once | INTRAMUSCULAR | Status: AC
  Administered 2020-06-28: 30 mg via INTRAMUSCULAR
  Filled 2020-06-28: qty 2
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
## 2020-06-28 NOTE — ED Notes (Signed)
Assumed care of pt upon being roomed. Reports pain in RLE as 8/10 at this time. Denies other concerns. AOx4, talking in full sentences with regular and unlabored breathing. Side rails up x2, pt in view of RN desk. Belongings at bedside.

## 2020-06-28 NOTE — ED Notes (Signed)
Unable to obtain VS, pt dissatisfied with dc stating "I need a ct." pt educated that XR was unremarkable and there was not a medical reason for a CT. Pt stomped out of ED upset. Taxi is not running to give pt a ride, per [employer] pt is able to sleep in lobby. AOx4, breathing regular and unlabored

## 2020-06-28 NOTE — Discharge Instructions (Addendum)
Wear Ace wrap as needed for comfort.  Use your walker to help you balance while you are.  You may take Tylenol and/or Ibuprofen as needed for discomfort.  Return to the ER for worsening symptoms, persistent vomiting, difficulty breathing or other concerns.

## 2020-06-28 NOTE — ED Notes (Signed)
Ice wrap applied to pt R knee

## 2020-07-06 ENCOUNTER — Encounter: Payer: Self-pay | Admitting: Emergency Medicine

## 2020-07-06 ENCOUNTER — Emergency Department
Admission: EM | Admit: 2020-07-06 | Discharge: 2020-07-06 | Disposition: A | Discharge status: Home / Self Care | Payer: Medicare HMO | Attending: Emergency Medicine | Admitting: Emergency Medicine

## 2020-07-06 DIAGNOSIS — N179 Acute kidney failure, unspecified: Secondary | ICD-10-CM | POA: Insufficient documentation

## 2020-07-06 DIAGNOSIS — J449 Chronic obstructive pulmonary disease, unspecified: Secondary | ICD-10-CM | POA: Insufficient documentation

## 2020-07-06 DIAGNOSIS — F10920 Alcohol use, unspecified with intoxication, uncomplicated: Secondary | ICD-10-CM

## 2020-07-06 DIAGNOSIS — F10220 Alcohol dependence with intoxication, uncomplicated: Secondary | ICD-10-CM | POA: Insufficient documentation

## 2020-07-06 DIAGNOSIS — T50901A Poisoning by unspecified drugs, medicaments and biological substances, accidental (unintentional), initial encounter: Secondary | ICD-10-CM | POA: Insufficient documentation

## 2020-07-06 DIAGNOSIS — F10950 Alcohol use, unspecified with alcohol-induced psychotic disorder with delusions: Secondary | ICD-10-CM

## 2020-07-06 DIAGNOSIS — F1010 Alcohol abuse, uncomplicated: Secondary | ICD-10-CM

## 2020-07-06 DIAGNOSIS — Y908 Blood alcohol level of 240 mg/100 ml or more: Secondary | ICD-10-CM | POA: Diagnosis not present

## 2020-07-06 DIAGNOSIS — Z20822 Contact with and (suspected) exposure to covid-19: Secondary | ICD-10-CM | POA: Insufficient documentation

## 2020-07-06 DIAGNOSIS — F17210 Nicotine dependence, cigarettes, uncomplicated: Secondary | ICD-10-CM | POA: Diagnosis not present

## 2020-07-06 DIAGNOSIS — F22 Delusional disorders: Secondary | ICD-10-CM | POA: Insufficient documentation

## 2020-07-06 DIAGNOSIS — J45909 Unspecified asthma, uncomplicated: Secondary | ICD-10-CM | POA: Insufficient documentation

## 2020-07-06 DIAGNOSIS — R443 Hallucinations, unspecified: Secondary | ICD-10-CM | POA: Insufficient documentation

## 2020-07-06 DIAGNOSIS — F10959 Alcohol use, unspecified with alcohol-induced psychotic disorder, unspecified: Secondary | ICD-10-CM

## 2020-07-06 LAB — CBC
HCT: 45.5 % (ref 39.0–52.0)
Hemoglobin: 15.8 g/dL (ref 13.0–17.0)
MCH: 33.4 pg (ref 26.0–34.0)
MCHC: 34.7 g/dL (ref 30.0–36.0)
MCV: 96.2 fL (ref 80.0–100.0)
Platelets: 298 10*3/uL (ref 150–400)
RBC: 4.73 MIL/uL (ref 4.22–5.81)
RDW: 12.8 % (ref 11.5–15.5)
WBC: 11.7 10*3/uL — ABNORMAL HIGH (ref 4.0–10.5)
nRBC: 0 % (ref 0.0–0.2)

## 2020-07-06 LAB — SALICYLATE LEVEL: Salicylate Lvl: <7 mg/dL — ABNORMAL LOW (ref 7.0–30.0)

## 2020-07-06 LAB — COMPREHENSIVE METABOLIC PANEL
ALT: 17 U/L (ref 0–44)
AST: 27 U/L (ref 15–41)
Albumin: 4.4 g/dL (ref 3.5–5.0)
Alkaline Phosphatase: 101 U/L (ref 38–126)
Anion gap: 10 (ref 5–15)
BUN: 15 mg/dL (ref 6–20)
CO2: 20 mmol/L — ABNORMAL LOW (ref 22–32)
Calcium: 8.5 mg/dL — ABNORMAL LOW (ref 8.9–10.3)
Chloride: 112 mmol/L — ABNORMAL HIGH (ref 98–111)
Creatinine, Ser: 1.37 mg/dL — ABNORMAL HIGH (ref 0.61–1.24)
GFR, Estimated: >60 mL/min (ref >60)
Glucose, Bld: 94 mg/dL (ref 70–99)
Potassium: 4.1 mmol/L (ref 3.5–5.1)
Sodium: 142 mmol/L (ref 135–145)
Total Bilirubin: 0.4 mg/dL (ref 0.3–1.2)
Total Protein: 6.8 g/dL (ref 6.5–8.1)

## 2020-07-06 LAB — LIPASE, BLOOD: Lipase: 59 U/L — ABNORMAL HIGH (ref 11–51)

## 2020-07-06 LAB — RESP PANEL BY RT-PCR (FLU A&B, COVID) ARPGX2
Influenza A by PCR: NEGATIVE
Influenza B by PCR: NEGATIVE
SARS Coronavirus 2 by RT PCR: NEGATIVE

## 2020-07-06 LAB — ETHANOL: Alcohol, Ethyl (B): 275 mg/dL — ABNORMAL HIGH (ref <10)

## 2020-07-06 LAB — ACETAMINOPHEN LEVEL: Acetaminophen (Tylenol), Serum: <10 ug/mL — ABNORMAL LOW (ref 10–30)

## 2020-07-06 MED ORDER — LORAZEPAM 2 MG/ML IJ SOLN: 0.0000 mg | Freq: Four times a day (QID) | INTRAMUSCULAR | Status: DC

## 2020-07-06 MED ORDER — NICOTINE 14 MG/24HR TD PT24
14.0000 mg | MEDICATED_PATCH | Freq: Every day | TRANSDERMAL | Status: DC
  Administered 2020-07-06: 14 mg via TRANSDERMAL
  Filled 2020-07-06: qty 1

## 2020-07-06 MED ORDER — ZIPRASIDONE MESYLATE 20 MG IM SOLR
20.0000 mg | Freq: Once | INTRAMUSCULAR | Status: AC
  Administered 2020-07-06: 20 mg via INTRAMUSCULAR
  Filled 2020-07-06: qty 20

## 2020-07-06 MED ORDER — LORAZEPAM 2 MG PO TABS: 0.0000 mg | ORAL_TABLET | Freq: Four times a day (QID) | ORAL | Status: DC

## 2020-07-06 MED ORDER — PREGABALIN 100 MG PO CAPS
100.0000 mg | ORAL_CAPSULE | Freq: Two times a day (BID) | ORAL | Status: DC
  Administered 2020-07-06: 100 mg via ORAL
  Filled 2020-07-06: qty 2

## 2020-07-06 MED ORDER — LORAZEPAM 2 MG/ML IJ SOLN: 0.0000 mg | Freq: Two times a day (BID) | INTRAMUSCULAR | Status: DC

## 2020-07-06 MED ORDER — THIAMINE HCL 100 MG/ML IJ SOLN
100.0000 mg | Freq: Every day | INTRAMUSCULAR | Status: DC
  Filled 2020-07-06: qty 1

## 2020-07-06 MED ORDER — THIAMINE HCL 100 MG PO TABS
100.0000 mg | ORAL_TABLET | Freq: Every day | ORAL | Status: DC
  Administered 2020-07-06: 100 mg via ORAL
  Filled 2020-07-06: qty 1

## 2020-07-06 MED ORDER — LORAZEPAM 2 MG PO TABS: 0.0000 mg | ORAL_TABLET | Freq: Two times a day (BID) | ORAL | Status: DC

## 2020-07-06 NOTE — Consult Note (Signed)
[facility] Face-to-Face Psychiatry Consult   Reason for Consult:    Consult follow-up with this 35 year old man with alcohol abuse who came to the emergency room with psychotic paranoid symptoms Referring Physician: Quale Patient Identification: Donald [name] MRN:  [id number] Principal Diagnosis: Alcoholic psychosis (HCC) Diagnosis:  Principal Problem:   Alcoholic psychosis (HCC) Active Problems:   Alcohol abuse   Total Time spent with patient: 45 minutes  Subjective:   Donald [name] is a 35 y.o. male patient admitted with "I thought someone was poisoning me but they were not".  HPI: Patient seen chart reviewed.  35 year old man presented to the emergency room complaining that he thought someone was poisoning him.  He said this was because he had a dry mouth.  Was agitated and somewhat disorganized initially.  Blood alcohol level almost 300.  On reevaluation today the patient is alert and oriented.  He tells me that he believed someone was poisoning him because he was drunk but that he now knows that was not true.  He says he had a lot to drink yesterday much more than his usual daily consumption.  Minimizes the degree to which alcohol is a regular problem for him.  Currently denies depression, denies suicidal thought, denies active psychosis.  Denies any history of seizures or DTs from alcohol use  Past Psychiatric History: History of alcohol abuse with similar presentation that he becomes paranoid with psychotic symptoms when drunk which cleared up once he is sober.  Risk to Self:   Risk to Others:   Prior Inpatient Therapy:   Prior Outpatient Therapy:    Past Medical History:  Past Medical History:  Diagnosis Date  . Asthma   . COPD (chronic obstructive pulmonary disease) (HCC)     Past Surgical History:  Procedure Laterality Date  . ELBOW SURGERY    . MANDIBLE FRACTURE SURGERY    . SHOULDER ARTHROSCOPY     Family History: History reviewed. No pertinent family history. Family  Psychiatric  History: None reported Social History:  Social History   Substance and Sexual Activity  Alcohol Use Yes   Comment: social drinker     Social History   Substance and Sexual Activity  Drug Use Yes    Social History   Socioeconomic History  . Marital status: Single    Spouse name: Not on file  . Number of children: Not on file  . Years of education: Not on file  . Highest education level: Not on file  Occupational History  . Not on file  Tobacco Use  . Smoking status: Current Every Day Smoker    Packs/day: 0.50    Types: Cigarettes  . Smokeless tobacco: Never Used  . Tobacco comment: 1/5 ppd  Substance and Sexual Activity  . Alcohol use: Yes    Comment: social drinker  . Drug use: Yes  . Sexual activity: Not on file  Other Topics Concern  . Not on file  Social History Narrative  . Not on file   Social Determinants of Health   Financial Resource Strain: Not on file  Food Insecurity: Not on file  Transportation Needs: Not on file  Physical Activity: Not on file  Stress: Not on file  Social Connections: Not on file   Additional Social History:    Allergies:   Allergies  Allergen Reactions  . Ceclor  [Cefaclor] Rash and Hives    Labs:  Results for orders placed or performed during the hospital encounter of 07/06/20 (from the past 48 hour(s))  Comprehensive metabolic panel     Status: Abnormal   Collection Time: 07/06/20  2:16 AM  Result Value Ref Range   Sodium 142 135 - 145 mmol/L   Potassium 4.1 3.5 - 5.1 mmol/L   Chloride 112 (H) 98 - 111 mmol/L   CO2 20 (L) 22 - 32 mmol/L   Glucose, Bld 94 70 - 99 mg/dL    Comment: Glucose reference range applies only to samples taken after fasting for at least 8 hours.   BUN 15 6 - 20 mg/dL   Creatinine, Ser 6.96 (H) 0.61 - 1.24 mg/dL   Calcium 8.5 (L) 8.9 - 10.3 mg/dL   Total Protein 6.8 6.5 - 8.1 g/dL   Albumin 4.4 3.5 - 5.0 g/dL   AST 27 15 - 41 U/L   ALT 17 0 - 44 U/L   Alkaline Phosphatase 101  38 - 126 U/L   Total Bilirubin 0.4 0.3 - 1.2 mg/dL   GFR, Estimated >29 >52 mL/min    Comment: (NOTE) Calculated using the CKD-EPI Creatinine Equation (2021)    Anion gap 10 5 - 15    Comment: Performed at [facility], [address] [place], [place] [address]  Ethanol     Status: Abnormal   Collection Time: 07/06/20  2:16 AM  Result Value Ref Range   Alcohol, Ethyl (B) 275 (H) <10 mg/dL    Comment: (NOTE) Lowest detectable limit for serum alcohol is 10 mg/dL.  For medical purposes only. Performed at [facility], [address] [place], [place] [address]   Salicylate level     Status: Abnormal   Collection Time: 07/06/20  2:16 AM  Result Value Ref Range   Salicylate Lvl <7.0 (L) 7.0 - 30.0 mg/dL    Comment: Performed at [facility], [address] [place], [place] [address]  Acetaminophen level     Status: Abnormal   Collection Time: 07/06/20  2:16 AM  Result Value Ref Range   Acetaminophen (Tylenol), Serum <10 (L) 10 - 30 ug/mL    Comment: (NOTE) Therapeutic concentrations vary significantly. A range of 10-30 ug/mL  may be an effective concentration for many patients. However, some  are best treated at concentrations outside of this range. Acetaminophen concentrations >150 ug/mL at 4 hours after ingestion  and >50 ug/mL at 12 hours after ingestion are often associated with  toxic reactions.  Performed at [facility], [address] [place], [place] [address]   cbc     Status: Abnormal   Collection Time: 07/06/20  2:16 AM  Result Value Ref Range   WBC 11.7 (H) 4.0 - 10.5 K/uL   RBC 4.73 4.22 - 5.81 MIL/uL   Hemoglobin 15.8 13.0 - 17.0 g/dL   HCT 34.7 42.5 - 95.6 %   MCV 96.2 80.0 - 100.0 fL   MCH 33.4 26.0 - 34.0 pg   MCHC 34.7 30.0 - 36.0 g/dL   RDW 38.7 56.4 - 33.2 %   Platelets 298 150 - 400 K/uL   nRBC 0.0 0.0 - 0.2 %    Comment: Performed at [facility], [address] [place], [place] [address]   Lipase, blood     Status: Abnormal   Collection Time: 07/06/20  2:16 AM  Result Value Ref Range   Lipase 59 (H) 11 - 51 U/L    Comment: Performed at [facility], [address] [place], [place] [address]  Resp Panel by RT-PCR (Flu A&B, Covid) Nasopharyngeal Swab  Status: None   Collection Time: 07/06/20  2:16 AM   Specimen: Nasopharyngeal Swab; Nasopharyngeal(NP) swabs in vial transport medium  Result Value Ref Range   SARS Coronavirus 2 by RT PCR NEGATIVE NEGATIVE    Comment: (NOTE) SARS-CoV-2 target nucleic acids are NOT DETECTED.  The SARS-CoV-2 RNA is generally detectable in upper respiratory specimens during the acute phase of infection. The lowest concentration of SARS-CoV-2 viral copies this assay can detect is 138 copies/mL. A negative result does not preclude SARS-Cov-2 infection and should not be used as the sole basis for treatment or other patient management decisions. A negative result may occur with  improper specimen collection/handling, submission of specimen other than nasopharyngeal swab, presence of viral mutation(s) within the areas targeted by this assay, and inadequate number of viral copies(<138 copies/mL). A negative result must be combined with clinical observations, patient history, and epidemiological information. The expected result is Negative.  Fact Sheet for Patients:  BloggerCourse.com  Fact Sheet for Healthcare Providers:  SeriousBroker.it  This test is no t yet approved or cleared by the [place] FDA and  has been authorized for detection and/or diagnosis of SARS-CoV-2 by FDA under an Emergency Use Authorization (EUA). This EUA will remain  in effect (meaning this test can be used) for the duration of the COVID-19 declaration under Section 564(b)(1) of the Act, 21 U.S.C.section 360bbb-3(b)(1), unless the authorization is terminated  or revoked sooner.       Influenza A by  PCR NEGATIVE NEGATIVE   Influenza B by PCR NEGATIVE NEGATIVE    Comment: (NOTE) The Xpert Xpress SARS-CoV-2/FLU/RSV plus assay is intended as an aid in the diagnosis of influenza from Nasopharyngeal swab specimens and should not be used as a sole basis for treatment. Nasal washings and aspirates are unacceptable for Xpert Xpress SARS-CoV-2/FLU/RSV testing.  Fact Sheet for Patients: BloggerCourse.com  Fact Sheet for Healthcare Providers: SeriousBroker.it  This test is not yet approved or cleared by the [place] FDA and has been authorized for detection and/or diagnosis of SARS-CoV-2 by FDA under an Emergency Use Authorization (EUA). This EUA will remain in effect (meaning this test can be used) for the duration of the COVID-19 declaration under Section 564(b)(1) of the Act, 21 U.S.C. section 360bbb-3(b)(1), unless the authorization is terminated or revoked.  Performed at [facility] [place], [address], [place], [place] [address]     Current Facility-Administered Medications  Medication Dose Route Frequency Provider Last Rate Last Admin  . LORazepam (ATIVAN) injection 0-4 mg  0-4 mg Intravenous Q6H Ward, Kristen N, DO       Or  . LORazepam (ATIVAN) tablet 0-4 mg  0-4 mg Oral Q6H Ward, Kristen N, DO      . [START ON 07/08/2020] LORazepam (ATIVAN) injection 0-4 mg  0-4 mg Intravenous Q12H Ward, Kristen N, DO       Or  . Melene Muller ON 07/08/2020] LORazepam (ATIVAN) tablet 0-4 mg  0-4 mg Oral Q12H Ward, Kristen N, DO      . nicotine (NICODERM CQ - dosed in mg/24 hours) patch 14 mg  14 mg Transdermal Daily Ward, Kristen N, DO   14 mg at 07/06/20 1132  . pregabalin (LYRICA) capsule 100 mg  100 mg Oral BID Ward, Kristen N, DO   100 mg at 07/06/20 1131  . thiamine tablet 100 mg  100 mg Oral Daily Ward, Kristen N, DO   100 mg at 07/06/20 1131   Or  . thiamine (B-1) injection 100 mg  100 mg Intravenous Daily Ward, Kristen N, DO        Current Outpatient Medications  Medication Sig Dispense Refill  . nicotine (NICODERM CQ - DOSED IN MG/24 HOURS) 14 mg/24hr patch Place 1 patch (14 mg total) onto the skin daily. 28 patch 0  . pregabalin (LYRICA) 100 MG capsule Take 100 mg by mouth 2 (two) times daily.      Musculoskeletal: Strength & Muscle Tone: within normal limits Gait & Station: normal Patient leans: N/A            Psychiatric Specialty Exam:  Presentation  General Appearance: No data recorded Eye Contact:No data recorded Speech:No data recorded Speech Volume:No data recorded Handedness:No data recorded  Mood and Affect  Mood:No data recorded Affect:No data recorded  Thought Process  Thought Processes:No data recorded Descriptions of Associations:No data recorded Orientation:No data recorded Thought Content:No data recorded History of Schizophrenia/Schizoaffective disorder:No  Duration of Psychotic Symptoms:No data recorded Hallucinations:No data recorded Ideas of Reference:No data recorded Suicidal Thoughts:No data recorded Homicidal Thoughts:No data recorded  Sensorium  Memory:No data recorded Judgment:No data recorded Insight:No data recorded  Executive Functions  Concentration:No data recorded Attention Span:No data recorded Recall:No data recorded Fund of Knowledge:No data recorded Language:No data recorded  Psychomotor Activity  Psychomotor Activity:No data recorded  Assets  Assets:No data recorded  Sleep  Sleep:No data recorded  Physical Exam: Physical Exam Vitals and nursing note reviewed.  Constitutional:      Appearance: Normal appearance.  HENT:     Head: Normocephalic and atraumatic.     Mouth/Throat:     Pharynx: Oropharynx is clear.  Eyes:     Pupils: Pupils are equal, round, and reactive to light.  Cardiovascular:     Rate and Rhythm: Normal rate and regular rhythm.  Pulmonary:     Effort: Pulmonary effort is normal.     Breath sounds: Normal  breath sounds.  Abdominal:     General: Abdomen is flat.     Palpations: Abdomen is soft.  Musculoskeletal:        General: Normal range of motion.  Skin:    General: Skin is warm and dry.  Neurological:     General: No focal deficit present.     Mental Status: He is alert. Mental status is at baseline.  Psychiatric:        Mood and Affect: Mood normal.        Thought Content: Thought content normal.    Review of Systems  Constitutional: Negative.   HENT: Negative.   Eyes: Negative.   Respiratory: Negative.   Cardiovascular: Negative.   Gastrointestinal: Negative.   Musculoskeletal: Negative.   Skin: Negative.   Neurological: Negative.   Psychiatric/Behavioral: Positive for memory loss and substance abuse. Negative for depression, hallucinations and suicidal ideas. The patient has insomnia. The patient is not nervous/anxious.    Blood pressure (!) 120/1, pulse 69, temperature 97.8 F (36.6 C), temperature source Oral, resp. rate 20, SpO2 99 %. There is no height or weight on file to calculate BMI.  Treatment Plan Summary: Plan 35 year old man no longer voicing psychotic thoughts.  Not paranoid.  Calm and lucid.  Patient denies suicidal ideation.  Not tremulous no sign of delirium.  No longer meets commitment criteria.  Discontinue IVC.  Recommend that he can be released from the emergency room and strongly encouraged him to follow up with outpatient substance abuse treatment.  Disposition: No evidence of imminent risk to self or others at present.   Patient  does not meet criteria for psychiatric inpatient admission. Supportive therapy provided about ongoing stressors. Discussed crisis plan, support from social network, calling 911, coming to the Emergency Department, and calling Suicide Hotline.  Mordecai Rasmussen, MD 07/06/2020 2:43 PM

## 2020-07-06 NOTE — ED Notes (Signed)
Pt belongings brought in by EMS include: 1 oversized green and brown bag, 1 black backpack  Pt changed out into burgundy scrubs with security in room. Clothing removed and placed into a personal belongings bag with pt information label placed on outside.  Belongings removed include:  1 black shirt 2 gray socks 1 green and brown short 1 black belt 2 black shoes 1 red brief 1 black wallet

## 2020-07-06 NOTE — ED Notes (Signed)
IVC/Consult pending/ Moved to [facility]-3 
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
## 2020-07-06 NOTE — ED Notes (Addendum)
Lunch provided in pt room. TV on and lights off. Pt calm and cooperative.

## 2020-07-06 NOTE — ED Notes (Signed)
Pt refusing to have blood drawn and grabbing at bags while stating, "Im leaving." Security called and in hallway while Clinical research associate explains that pt has had IVC paperwork taken out and that he needs to be moved out of the hallway and into a room so that he can be evaluated and treated. Pt continuing to yell and threat staff. Officers at bedside able to walk pt to room 23. Pt in room 23 turns and attempts to throw back pack at officer when its grabbed by officer and given to writer that is at the doorway. Officers x4 in room at this time and pt agrees to sit down and now wants to dress out and allow staff to draw blood and give medication.

## 2020-07-06 NOTE — BH Assessment (Signed)
Patient unable to be assessed due to being medicated. TTS to follow up.
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
## 2020-07-06 NOTE — ED Provider Notes (Signed)
-----------------------------------------   6:16 PM on 07/06/2020 -----------------------------------------  This patient has been evaluated by Dr. Toni Amend.  The IVC has been rescinded.  He has been cleared for discharge.  I have provided return precautions and outpatient substance abuse treatment referral.     Dionne Bucy, MD 07/06/20 [phone]
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
## 2020-07-06 NOTE — ED Notes (Signed)
Pt discharged.  Pt did not want discharge inst nor signed esignature.  Security walked pt to lobby with his belongings.  Pt states 1 dollar bill and change not in his [place].

## 2020-07-06 NOTE — ED Notes (Signed)
Report received by Brunetta Genera

## 2020-07-06 NOTE — ED Notes (Signed)
Snack provided

## 2020-07-06 NOTE — ED Notes (Signed)
IVC  PAPERS  RESCINDED PER  DR  CLAPACS  MD  Mariel Kansky  RN

## 2020-07-06 NOTE — ED Notes (Signed)
Given water and meal tray per request. Pt cooperative and calm at this time

## 2020-07-06 NOTE — ED Notes (Signed)
Dinner meal given to pt

## 2020-07-06 NOTE — ED Notes (Signed)
Voluntarily allowed blood draw.

## 2020-07-06 NOTE — BH Assessment (Signed)
Patient is IM's with geodon 20mg  for aggressive behaviors and is unable to be assessed at this time. Of Note, patients BAL is 275 at time of attempt.

## 2020-07-06 NOTE — ED Notes (Signed)
Report to annie, rn.  

## 2020-07-06 NOTE — ED Notes (Signed)
Resumed care from andrea rn.  Pt alert. Pt lying on bed in room   

## 2020-07-06 NOTE — ED Notes (Signed)
Pt willingly took IM medication with ODS at bedside to assist. Pt did not require restraints to get medication.

## 2020-07-06 NOTE — ED Notes (Signed)
Dr. Toni Amend at bedside for pt evaluation

## 2020-08-14 DIAGNOSIS — M25552 Pain in left hip: Secondary | ICD-10-CM | POA: Diagnosis not present

## 2020-08-14 DIAGNOSIS — M25561 Pain in right knee: Secondary | ICD-10-CM | POA: Diagnosis not present

## 2020-08-14 DIAGNOSIS — M25551 Pain in right hip: Secondary | ICD-10-CM | POA: Diagnosis not present

## 2020-08-14 DIAGNOSIS — G8921 Chronic pain due to trauma: Secondary | ICD-10-CM | POA: Diagnosis not present

## 2020-08-14 DIAGNOSIS — F329 Major depressive disorder, single episode, unspecified: Secondary | ICD-10-CM | POA: Diagnosis not present

## 2020-08-14 DIAGNOSIS — M5432 Sciatica, left side: Secondary | ICD-10-CM | POA: Diagnosis not present

## 2020-08-14 DIAGNOSIS — R252 Cramp and spasm: Secondary | ICD-10-CM | POA: Diagnosis not present

## 2020-08-14 DIAGNOSIS — M25562 Pain in left knee: Secondary | ICD-10-CM | POA: Diagnosis not present

## 2020-08-14 DIAGNOSIS — B189 Chronic viral hepatitis, unspecified: Secondary | ICD-10-CM | POA: Diagnosis not present

## 2020-12-08 DIAGNOSIS — R102 Pelvic and perineal pain: Secondary | ICD-10-CM | POA: Diagnosis not present

## 2020-12-08 DIAGNOSIS — Z9889 Other specified postprocedural states: Secondary | ICD-10-CM | POA: Diagnosis not present

## 2020-12-08 DIAGNOSIS — R059 Cough, unspecified: Secondary | ICD-10-CM | POA: Diagnosis not present

## 2020-12-08 DIAGNOSIS — J441 Chronic obstructive pulmonary disease with (acute) exacerbation: Secondary | ICD-10-CM | POA: Diagnosis not present

## 2020-12-08 DIAGNOSIS — M79672 Pain in left foot: Secondary | ICD-10-CM | POA: Diagnosis not present

## 2020-12-08 DIAGNOSIS — M898X8 Other specified disorders of bone, other site: Secondary | ICD-10-CM | POA: Diagnosis not present

## 2020-12-08 DIAGNOSIS — R6889 Other general symptoms and signs: Secondary | ICD-10-CM | POA: Diagnosis not present

## 2020-12-08 DIAGNOSIS — S99922A Unspecified injury of left foot, initial encounter: Secondary | ICD-10-CM | POA: Diagnosis not present

## 2020-12-08 DIAGNOSIS — R0981 Nasal congestion: Secondary | ICD-10-CM | POA: Diagnosis not present

## 2020-12-22 DIAGNOSIS — R0600 Dyspnea, unspecified: Secondary | ICD-10-CM | POA: Diagnosis not present

## 2020-12-22 DIAGNOSIS — R918 Other nonspecific abnormal finding of lung field: Secondary | ICD-10-CM | POA: Diagnosis not present

## 2020-12-22 DIAGNOSIS — Q279 Congenital malformation of peripheral vascular system, unspecified: Secondary | ICD-10-CM | POA: Diagnosis not present
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
## 2022-09-11 DIAGNOSIS — Z9889 Other specified postprocedural states: Secondary | ICD-10-CM | POA: Diagnosis not present

## 2022-09-11 DIAGNOSIS — M25572 Pain in left ankle and joints of left foot: Secondary | ICD-10-CM | POA: Diagnosis not present

## 2022-09-11 DIAGNOSIS — S91312A Laceration without foreign body, left foot, initial encounter: Secondary | ICD-10-CM | POA: Diagnosis not present

## 2022-09-11 DIAGNOSIS — F319 Bipolar disorder, unspecified: Secondary | ICD-10-CM | POA: Diagnosis not present

## 2022-09-11 DIAGNOSIS — Z720 Tobacco use: Secondary | ICD-10-CM | POA: Diagnosis not present

## 2022-09-11 DIAGNOSIS — F419 Anxiety disorder, unspecified: Secondary | ICD-10-CM | POA: Diagnosis not present

## 2022-09-11 DIAGNOSIS — Z5901 Sheltered homelessness: Secondary | ICD-10-CM | POA: Diagnosis not present

## 2022-09-14 DIAGNOSIS — R944 Abnormal results of kidney function studies: Secondary | ICD-10-CM | POA: Diagnosis not present

## 2022-09-14 DIAGNOSIS — F319 Bipolar disorder, unspecified: Secondary | ICD-10-CM | POA: Diagnosis not present

## 2022-09-14 DIAGNOSIS — R0981 Nasal congestion: Secondary | ICD-10-CM | POA: Diagnosis not present

## 2022-09-14 DIAGNOSIS — Z20822 Contact with and (suspected) exposure to covid-19: Secondary | ICD-10-CM | POA: Diagnosis not present

## 2022-09-14 DIAGNOSIS — M791 Myalgia, unspecified site: Secondary | ICD-10-CM | POA: Diagnosis not present

## 2022-09-14 DIAGNOSIS — R051 Acute cough: Secondary | ICD-10-CM | POA: Diagnosis not present

## 2022-09-14 DIAGNOSIS — R451 Restlessness and agitation: Secondary | ICD-10-CM | POA: Diagnosis not present

## 2022-09-14 DIAGNOSIS — F419 Anxiety disorder, unspecified: Secondary | ICD-10-CM | POA: Diagnosis not present

## 2022-09-14 DIAGNOSIS — E860 Dehydration: Secondary | ICD-10-CM | POA: Diagnosis not present

## 2022-09-14 DIAGNOSIS — R058 Other specified cough: Secondary | ICD-10-CM | POA: Diagnosis not present

## 2022-09-14 DIAGNOSIS — R55 Syncope and collapse: Secondary | ICD-10-CM | POA: Diagnosis not present

## 2023-08-31 DIAGNOSIS — Z720 Tobacco use: Secondary | ICD-10-CM | POA: Diagnosis not present

## 2023-08-31 DIAGNOSIS — S42032A Displaced fracture of lateral end of left clavicle, initial encounter for closed fracture: Secondary | ICD-10-CM | POA: Diagnosis not present

## 2023-08-31 DIAGNOSIS — S42002A Fracture of unspecified part of left clavicle, initial encounter for closed fracture: Secondary | ICD-10-CM | POA: Diagnosis not present

## 2023-08-31 DIAGNOSIS — L720 Epidermal cyst: Secondary | ICD-10-CM | POA: Diagnosis not present

## 2023-08-31 DIAGNOSIS — F319 Bipolar disorder, unspecified: Secondary | ICD-10-CM | POA: Diagnosis not present

## 2023-08-31 DIAGNOSIS — M25572 Pain in left ankle and joints of left foot: Secondary | ICD-10-CM | POA: Diagnosis not present

## 2023-09-04 DIAGNOSIS — S42032A Displaced fracture of lateral end of left clavicle, initial encounter for closed fracture: Secondary | ICD-10-CM | POA: Diagnosis not present
